# Patient Record
Sex: Male | Born: 1960 | Race: White | Hispanic: No | Marital: Married | Smoking: Former smoker
Health system: Southern US, Community
[De-identification: ages and names within clinical notes are randomized; demographics above are authoritative.]

## PROBLEM LIST (undated history)

## (undated) DIAGNOSIS — I10 Essential (primary) hypertension: Secondary | ICD-10-CM

## (undated) HISTORY — DX: Essential (primary) hypertension: I10

---

## 2000-10-03 ENCOUNTER — Emergency Department (HOSPITAL_COMMUNITY): Admission: EM | Admit: 2000-10-03 | Discharge: 2000-10-04 | Payer: Self-pay | Admitting: Emergency Medicine

## 2000-10-04 ENCOUNTER — Encounter: Payer: Self-pay | Admitting: Emergency Medicine

## 2000-12-31 ENCOUNTER — Ambulatory Visit (HOSPITAL_COMMUNITY): Admission: RE | Admit: 2000-12-31 | Discharge: 2000-12-31 | Payer: Self-pay | Admitting: Neurological Surgery

## 2000-12-31 ENCOUNTER — Encounter: Payer: Self-pay | Admitting: Neurological Surgery

## 2001-08-18 ENCOUNTER — Ambulatory Visit (HOSPITAL_COMMUNITY): Admission: RE | Admit: 2001-08-18 | Discharge: 2001-08-18 | Payer: Self-pay | Admitting: Neurological Surgery

## 2001-08-18 ENCOUNTER — Encounter: Payer: Self-pay | Admitting: Neurological Surgery

## 2001-08-26 ENCOUNTER — Observation Stay (HOSPITAL_COMMUNITY): Admission: RE | Admit: 2001-08-26 | Discharge: 2001-08-26 | Payer: Self-pay | Admitting: Neurological Surgery

## 2001-08-26 ENCOUNTER — Encounter: Payer: Self-pay | Admitting: Neurological Surgery

## 2001-09-12 ENCOUNTER — Encounter: Admission: RE | Admit: 2001-09-12 | Discharge: 2001-09-12 | Payer: Self-pay | Admitting: Neurological Surgery

## 2001-09-12 ENCOUNTER — Encounter: Payer: Self-pay | Admitting: Neurological Surgery

## 2020-07-23 ENCOUNTER — Emergency Department (HOSPITAL_COMMUNITY): Payer: Commercial Managed Care - PPO

## 2020-07-23 ENCOUNTER — Encounter (HOSPITAL_COMMUNITY): Payer: Self-pay

## 2020-07-23 ENCOUNTER — Inpatient Hospital Stay (HOSPITAL_COMMUNITY)
Admission: EM | Admit: 2020-07-23 | Discharge: 2020-07-29 | DRG: 177 | Disposition: A | Discharge status: Home / Self Care | Payer: Commercial Managed Care - PPO | Attending: Internal Medicine | Admitting: Internal Medicine

## 2020-07-23 ENCOUNTER — Other Ambulatory Visit: Payer: Self-pay

## 2020-07-23 DIAGNOSIS — I10 Essential (primary) hypertension: Secondary | ICD-10-CM | POA: Diagnosis present

## 2020-07-23 DIAGNOSIS — R0602 Shortness of breath: Secondary | ICD-10-CM | POA: Diagnosis present

## 2020-07-23 DIAGNOSIS — K649 Unspecified hemorrhoids: Secondary | ICD-10-CM | POA: Diagnosis present

## 2020-07-23 DIAGNOSIS — R739 Hyperglycemia, unspecified: Secondary | ICD-10-CM | POA: Diagnosis not present

## 2020-07-23 DIAGNOSIS — E119 Type 2 diabetes mellitus without complications: Secondary | ICD-10-CM

## 2020-07-23 DIAGNOSIS — R0902 Hypoxemia: Secondary | ICD-10-CM

## 2020-07-23 DIAGNOSIS — Z79899 Other long term (current) drug therapy: Secondary | ICD-10-CM | POA: Diagnosis not present

## 2020-07-23 DIAGNOSIS — Z794 Long term (current) use of insulin: Secondary | ICD-10-CM | POA: Diagnosis not present

## 2020-07-23 DIAGNOSIS — T380X5A Adverse effect of glucocorticoids and synthetic analogues, initial encounter: Secondary | ICD-10-CM | POA: Diagnosis not present

## 2020-07-23 DIAGNOSIS — J1282 Pneumonia due to coronavirus disease 2019: Secondary | ICD-10-CM | POA: Diagnosis present

## 2020-07-23 DIAGNOSIS — E876 Hypokalemia: Secondary | ICD-10-CM | POA: Diagnosis present

## 2020-07-23 DIAGNOSIS — E1165 Type 2 diabetes mellitus with hyperglycemia: Secondary | ICD-10-CM | POA: Diagnosis not present

## 2020-07-23 DIAGNOSIS — J9601 Acute respiratory failure with hypoxia: Secondary | ICD-10-CM | POA: Diagnosis present

## 2020-07-23 DIAGNOSIS — U071 COVID-19: Secondary | ICD-10-CM | POA: Diagnosis present

## 2020-07-23 DIAGNOSIS — E1169 Type 2 diabetes mellitus with other specified complication: Secondary | ICD-10-CM | POA: Diagnosis not present

## 2020-07-23 LAB — PROCALCITONIN: Procalcitonin: 0.28 ng/mL

## 2020-07-23 LAB — COMPREHENSIVE METABOLIC PANEL
ALT: 42 U/L (ref 0–44)
AST: 32 U/L (ref 15–41)
Albumin: 3.2 g/dL — ABNORMAL LOW (ref 3.5–5.0)
Alkaline Phosphatase: 63 U/L (ref 38–126)
Anion gap: 11 (ref 5–15)
BUN: 22 mg/dL — ABNORMAL HIGH (ref 6–20)
CO2: 25 mmol/L (ref 22–32)
Calcium: 8.4 mg/dL — ABNORMAL LOW (ref 8.9–10.3)
Chloride: 106 mmol/L (ref 98–111)
Creatinine, Ser: 1.02 mg/dL (ref 0.61–1.24)
GFR calc Af Amer: >60 mL/min (ref >60)
GFR calc non Af Amer: >60 mL/min (ref >60)
Glucose, Bld: 134 mg/dL — ABNORMAL HIGH (ref 70–99)
Potassium: 3.1 mmol/L — ABNORMAL LOW (ref 3.5–5.1)
Sodium: 142 mmol/L (ref 135–145)
Total Bilirubin: 0.5 mg/dL (ref 0.3–1.2)
Total Protein: 6.8 g/dL (ref 6.5–8.1)

## 2020-07-23 LAB — CBC WITH DIFFERENTIAL/PLATELET
Abs Immature Granulocytes: 0.06 10*3/uL (ref 0.00–0.07)
Basophils Absolute: 0 10*3/uL (ref 0.0–0.1)
Basophils Relative: 0 %
Eosinophils Absolute: 0 10*3/uL (ref 0.0–0.5)
Eosinophils Relative: 0 %
HCT: 45.7 % (ref 39.0–52.0)
Hemoglobin: 15.1 g/dL (ref 13.0–17.0)
Immature Granulocytes: 1 %
Lymphocytes Relative: 14 %
Lymphs Abs: 1.1 10*3/uL (ref 0.7–4.0)
MCH: 29.1 pg (ref 26.0–34.0)
MCHC: 33 g/dL (ref 30.0–36.0)
MCV: 88.1 fL (ref 80.0–100.0)
Monocytes Absolute: 0.7 10*3/uL (ref 0.1–1.0)
Monocytes Relative: 8 %
Neutro Abs: 6.2 10*3/uL (ref 1.7–7.7)
Neutrophils Relative %: 77 %
Platelets: 292 10*3/uL (ref 150–400)
RBC: 5.19 MIL/uL (ref 4.22–5.81)
RDW: 12.6 % (ref 11.5–15.5)
WBC: 8.1 10*3/uL (ref 4.0–10.5)
nRBC: 0 % (ref 0.0–0.2)

## 2020-07-23 LAB — CBC
HCT: 47.1 % (ref 39.0–52.0)
Hemoglobin: 15.8 g/dL (ref 13.0–17.0)
MCH: 29.7 pg (ref 26.0–34.0)
MCHC: 33.5 g/dL (ref 30.0–36.0)
MCV: 88.5 fL (ref 80.0–100.0)
Platelets: 303 10*3/uL (ref 150–400)
RBC: 5.32 MIL/uL (ref 4.22–5.81)
RDW: 12.7 % (ref 11.5–15.5)
WBC: 7.7 10*3/uL (ref 4.0–10.5)
nRBC: 0 % (ref 0.0–0.2)

## 2020-07-23 LAB — D-DIMER, QUANTITATIVE: D-Dimer, Quant: 0.74 ug/mL-FEU — ABNORMAL HIGH (ref 0.00–0.50)

## 2020-07-23 LAB — CREATININE, SERUM
Creatinine, Ser: 1.11 mg/dL (ref 0.61–1.24)
GFR calc Af Amer: >60 mL/min (ref >60)
GFR calc non Af Amer: >60 mL/min (ref >60)

## 2020-07-23 LAB — C-REACTIVE PROTEIN: CRP: 5.3 mg/dL — ABNORMAL HIGH (ref <1.0)

## 2020-07-23 LAB — FIBRINOGEN: Fibrinogen: 529 mg/dL — ABNORMAL HIGH (ref 210–475)

## 2020-07-23 LAB — LACTIC ACID, PLASMA: Lactic Acid, Venous: 1.4 mmol/L (ref 0.5–1.9)

## 2020-07-23 LAB — FERRITIN: Ferritin: 859 ng/mL — ABNORMAL HIGH (ref 24–336)

## 2020-07-23 LAB — SARS CORONAVIRUS 2 BY RT PCR (HOSPITAL ORDER, PERFORMED IN ~~LOC~~ HOSPITAL LAB): SARS Coronavirus 2: POSITIVE — AB

## 2020-07-23 LAB — TRIGLYCERIDES: Triglycerides: 285 mg/dL — ABNORMAL HIGH (ref <150)

## 2020-07-23 LAB — LACTATE DEHYDROGENASE: LDH: 241 U/L — ABNORMAL HIGH (ref 98–192)

## 2020-07-23 LAB — MAGNESIUM: Magnesium: 1.8 mg/dL (ref 1.7–2.4)

## 2020-07-23 MED ORDER — SODIUM CHLORIDE 0.9 % IV SOLN
100.0000 mg | Freq: Every day | INTRAVENOUS | Status: AC
  Administered 2020-07-24 – 2020-07-27 (×4): 100 mg via INTRAVENOUS
  Filled 2020-07-23 (×4): qty 20

## 2020-07-23 MED ORDER — SODIUM CHLORIDE 0.9 % IV SOLN
200.0000 mg | Freq: Once | INTRAVENOUS | Status: AC
  Administered 2020-07-23: 200 mg via INTRAVENOUS
  Filled 2020-07-23: qty 200

## 2020-07-23 MED ORDER — ZOLPIDEM TARTRATE 10 MG PO TABS
10.0000 mg | ORAL_TABLET | Freq: Once | ORAL | Status: AC
  Administered 2020-07-23: 10 mg via ORAL
  Filled 2020-07-23: qty 1

## 2020-07-23 MED ORDER — HYDROCOD POLST-CPM POLST ER 10-8 MG/5ML PO SUER
5.0000 mL | Freq: Two times a day (BID) | ORAL | Status: DC | PRN
  Administered 2020-07-23: 5 mL via ORAL
  Filled 2020-07-23: qty 5

## 2020-07-23 MED ORDER — METHYLPREDNISOLONE SODIUM SUCC 125 MG IJ SOLR
125.0000 mg | Freq: Once | INTRAMUSCULAR | Status: AC
  Administered 2020-07-23: 125 mg via INTRAVENOUS
  Filled 2020-07-23: qty 2

## 2020-07-23 MED ORDER — ZINC SULFATE 220 (50 ZN) MG PO CAPS
220.0000 mg | ORAL_CAPSULE | Freq: Every day | ORAL | Status: DC
  Administered 2020-07-23 – 2020-07-29 (×7): 220 mg via ORAL
  Filled 2020-07-23 (×7): qty 1

## 2020-07-23 MED ORDER — ENOXAPARIN SODIUM 40 MG/0.4ML ~~LOC~~ SOLN
40.0000 mg | SUBCUTANEOUS | Status: DC
  Administered 2020-07-23: 40 mg via SUBCUTANEOUS
  Filled 2020-07-23: qty 0.4

## 2020-07-23 MED ORDER — ASCORBIC ACID 500 MG PO TABS
500.0000 mg | ORAL_TABLET | Freq: Every day | ORAL | Status: DC
  Administered 2020-07-23 – 2020-07-29 (×7): 500 mg via ORAL
  Filled 2020-07-23 (×7): qty 1

## 2020-07-23 MED ORDER — POTASSIUM CHLORIDE CRYS ER 20 MEQ PO TBCR
40.0000 meq | EXTENDED_RELEASE_TABLET | Freq: Once | ORAL | Status: AC
  Administered 2020-07-23: 40 meq via ORAL
  Filled 2020-07-23: qty 2

## 2020-07-23 MED ORDER — IPRATROPIUM-ALBUTEROL 20-100 MCG/ACT IN AERS
1.0000 | INHALATION_SPRAY | Freq: Four times a day (QID) | RESPIRATORY_TRACT | Status: DC
  Administered 2020-07-23: 1 via RESPIRATORY_TRACT
  Filled 2020-07-23: qty 4

## 2020-07-23 MED ORDER — HYDROCORTISONE ACETATE 25 MG RE SUPP
25.0000 mg | Freq: Two times a day (BID) | RECTAL | Status: DC
  Administered 2020-07-28: 25 mg via RECTAL
  Filled 2020-07-23 (×12): qty 1

## 2020-07-23 MED ORDER — PREDNISONE 50 MG PO TABS: 50.0000 mg | ORAL_TABLET | Freq: Every day | ORAL | Status: DC

## 2020-07-23 MED ORDER — METHYLPREDNISOLONE SODIUM SUCC 125 MG IJ SOLR
0.5000 mg/kg | Freq: Two times a day (BID) | INTRAMUSCULAR | Status: DC
  Administered 2020-07-24: 56.875 mg via INTRAVENOUS
  Filled 2020-07-23: qty 2

## 2020-07-23 MED ORDER — GUAIFENESIN-DM 100-10 MG/5ML PO SYRP
10.0000 mL | ORAL_SOLUTION | ORAL | Status: DC | PRN
  Administered 2020-07-24: 10 mL via ORAL
  Filled 2020-07-23: qty 10

## 2020-07-23 MED ORDER — ALBUTEROL SULFATE HFA 108 (90 BASE) MCG/ACT IN AERS
6.0000 | INHALATION_SPRAY | Freq: Once | RESPIRATORY_TRACT | Status: AC
  Administered 2020-07-23: 6 via RESPIRATORY_TRACT
  Filled 2020-07-23: qty 6.7

## 2020-07-23 NOTE — ED Notes (Signed)
Initially pt unable to come of non-rebreather with stat of 85% on [place]. PT currently on 6 L [place] with an o2 stat of 90-93%Spoke to  respiratory and they advised to leave pt on  unless 02 stat decreased to below 86%. Then called respiratory.

## 2020-07-23 NOTE — H&P (Addendum)
History and Physical    Ricardo Davis WER:[id number] DOB: 04-06-1961 DOA: 07/23/2020  PCP: Barbie Banner, MD Patient coming from: Home  Chief Complaint: Cough and shortness of breath for the last 7 to 10 days.  HPI: Ricardo Davis is a 59 y.o. male with medical history significant of hypertension non-smoker truck driver Covid +5 days prior to this admission in [place] with increasing cough and shortness of breath with fever.  He complains of nausea no vomiting or diarrhea abdominal pain or chest pain.  He was treated with Zithromax and Decadron as an outpatient without any significant benefit.  he did not take covid vaccine He has complaints of hemorrhoids chronic   ED Course: Vital signs 1 3685 temperature 99.1 respiratory rate 2192% on 6 L Labs potassium 3.1 sodium 142 BUN 22 creatinine 1.02 AST ALT normal LDH 241 triglycerides 285 ferritin 859 CRP 5.3 lactic acid 1.4 procalcitonin 0.28 white count 8.1 hemoglobin 15.1. 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 No pertinent past medical history.  History reviewed. No pertinent surgical history.  Social History   Socioeconomic History  . Marital status: Married    Spouse name: Not on file  . Number of children: Not on file  . Years of education: Not on file  . Highest education level: Not on file  Occupational History  . Not on file  Tobacco Use  . Smoking status: Not on file  Substance and Sexual Activity  . Alcohol use: Not on file  . Drug use: Not on file  . Sexual activity: Not on file  Other Topics Concern  . Not on file  Social History Narrative  . Not on file   Social Determinants of Health   Financial Resource Strain:   . Difficulty of Paying Living Expenses: Not on file  Food Insecurity:   . Worried About Programme researcher, broadcasting/film/video in the Last Year:  Not on file  . Ran Out of Food in the Last Year: Not on file  Transportation Needs:   . Lack of Transportation (Medical): Not on file  . Lack of Transportation (Non-Medical): Not on file  Physical Activity:   . Days of Exercise per Week: Not on file  . Minutes of Exercise per Session: Not on file  Stress:   . Feeling of Stress : Not on file  Social Connections:   . Frequency of Communication with Friends and Family: Not on file  . Frequency of Social Gatherings with Friends and Family: Not on file  . Attends Religious Services: Not on file  . Active Member of Clubs or Organizations: Not on file  . Attends Banker Meetings: Not on file  . Marital Status: Not on file  Intimate Partner Violence:   . Fear of Current or Ex-Partner: Not on file  . Emotionally Abused: Not on file  . Physically Abused: Not on file  . Sexually Abused: Not on file    Not on File  No family history on file.    Prior to Admission medications   Medication Sig Start Date End Date Taking? Authorizing Provider  albuterol (VENTOLIN HFA) 108 (90 Base) MCG/ACT inhaler Inhale 2 puffs into the lungs every 6 (six) hours as needed for shortness of breath or wheezing. 07/18/20   [provider]  amLODipine (NORVASC) 5 MG tablet Take 5 mg  by mouth daily. 06/30/20   [provider]  azithromycin (ZITHROMAX) 250 MG tablet Take by mouth. 07/18/20   [provider]  predniSONE (DELTASONE) 20 MG tablet Take 40 mg by mouth daily. For 5 days 07/18/20   [provider]  zolpidem (AMBIEN) 10 MG tablet Take 10 mg by mouth at bedtime as needed for sleep. 06/30/20   [provider]    Physical Exam: Vitals:   07/23/20 1530 07/23/20 1630 07/23/20 1700 07/23/20 1719  BP: 122/72 119/71 136/85   Pulse: 88 93 98   Resp: (!) 28 (!) 25 (!) 21   Temp:    99.1 F (37.3 C)  SpO2: 99% 94% 92%   Weight:      Height:         . General:  Appears calm and comfortable . Eyes:  PERRL,  EOMI, normal lids, iris . ENT:  grossly normal hearing, lips & tongue, mmm . Neck:  no LAD, masses or thyromegaly . Cardiovascular:  RRR, no m/r/g. No LE edema.  Marland Kitchen Respiratory: coarse breath sounds  bilaterally, no w/r/r. Normal respiratory effort. . Abdomen: soft, ntnd, NABS . Skin: no rash or induration seen on limited exam . Musculoskeletal:  grossly normal tone BUE/BLE, good ROM, no bony abnormality . Psychiatric: grossly normal mood and affect, speech fluent and appropriate, AOx3 . Neurologic:  CN 2-12 grossly intact, moves all extremities in coordinated fashion, sensation intact  Labs on Admission: I have personally reviewed following labs and imaging studies  CBC: Recent Labs  Lab 07/23/20 1531  WBC 8.1  NEUTROABS 6.2  HGB 15.1  HCT 45.7  MCV 88.1  PLT 292   Basic Metabolic Panel: Recent Labs  Lab 07/23/20 1531  NA 142  K 3.1*  CL 106  CO2 25  GLUCOSE 134*  BUN 22*  CREATININE 1.02  CALCIUM 8.4*   GFR: Estimated Creatinine Clearance: 108.6 mL/min (by C-G formula based on SCr of 1.02 mg/dL). Liver Function Tests: Recent Labs  Lab 07/23/20 1531  AST 32  ALT 42  ALKPHOS 63  BILITOT 0.5  PROT 6.8  ALBUMIN 3.2*   No results for input(s): LIPASE, AMYLASE in the last 168 hours. No results for input(s): AMMONIA in the last 168 hours. Coagulation Profile: No results for input(s): INR, PROTIME in the last 168 hours. Cardiac Enzymes: No results for input(s): CKTOTAL, CKMB, CKMBINDEX, TROPONINI in the last 168 hours. BNP (last 3 results) No results for input(s): PROBNP in the last 8760 hours. HbA1C: No results for input(s): HGBA1C in the last 72 hours. CBG: No results for input(s): GLUCAP in the last 168 hours. Lipid Profile: Recent Labs    07/23/20 1531  TRIG 285*   Thyroid Function Tests: No results for input(s): TSH, T4TOTAL, FREET4, T3FREE, THYROIDAB in the last 72 hours. Anemia Panel: Recent Labs    07/23/20 1531  FERRITIN 859*   Urine  analysis: No results found for: COLORURINE, APPEARANCEUR, LABSPEC, PHURINE, GLUCOSEU, HGBUR, BILIRUBINUR, KETONESUR, PROTEINUR, UROBILINOGEN, NITRITE, LEUKOCYTESUR  Creatinine Clearance: Estimated Creatinine Clearance: 108.6 mL/min (by C-G formula based on SCr of 1.02 mg/dL).  Sepsis Labs: @LABRCNTIP (procalcitonin:4,lacticidven:4) )No results found for this or any previous visit (from the past 240 hour(s)).   Radiological Exams on Admission: DG Chest Port 1 View  Result Date: 07/23/2020 CLINICAL DATA:  Shortness of breath. COVID-19 positive. EXAM: PORTABLE CHEST 1 VIEW COMPARISON:  None. FINDINGS: Cardiomediastinal silhouette is normal. Mediastinal contours appear intact. Hazy airspace opacities in bilateral lungs with lower lobe predominance.  Osseous structures are without acute abnormality. Soft tissues are grossly normal. IMPRESSION: Hazy airspace opacities in bilateral lungs with lower lobe predominance, consistent with multifocal atypical pneumonia. Electronically Signed   By: Ted Mcalpine M.D.   On: 07/23/2020 16:05      Assessment/Plan Active Problems:   Hypoxia   #1 acute hypoxic respiratory failure secondary to Covid pneumonia.  Patient is not on oxygen at home.  He is admitted with severe shortness of breath and cough and fever with nausea.  He is found to have bilateral infiltrates by chest x-ray with hypoxia dependent on 6 L to keep his sats above 90%.  Covid markers as above.   Covid order set initiated. Encourage proning  #2 hypokalemia replete.check mag  #3 hemorrhoids Anusol HC ordered   #4 HTN on norvasc at home hold bp soft   Severity of Illness: The appropriate patient status for this patient is INPATIENT. Inpatient status is judged to be reasonable and necessary in order to provide the required intensity of service to ensure the patient's safety. The patient's presenting symptoms, physical exam findings, and initial radiographic and laboratory data in the  context of their chronic comorbidities is felt to place them at high risk for further clinical deterioration. Furthermore, it is not anticipated that the patient will be medically stable for discharge from the hospital within 2 midnights of admission. The following factors support the patient status of inpatient.   " The patient's presenting symptoms include .cough sob " The worrisome physical exam findings include wheezing hypoxia " The initial radiographic and laboratory data are worrisome because of b/l infiltrates hypokalemia " The chronic co-morbidities include htn   * I certify that at the point of admission it is my clinical judgment that the patient will require inpatient hospital care spanning beyond 2 midnights from the point of admission due to high intensity of service, high risk for further deterioration and high frequency of surveillance required.*   Estimated body mass index is 31.25 kg/m as calculated from the following:   Height as of this encounter: 6\' 3"  (1.905 m).   Weight as of this encounter: 113.4 kg.   DVT prophylaxis:lovenox Code Status:  full Family Communication: none at bedside Disposition Plan:tbd Consults called: none Admission status:  In patient   MD  07/23/2020, 5:28 PM

## 2020-07-23 NOTE — ED Triage Notes (Signed)
BIB EMS form home. Test postive for covid apx. 2 weeks ago was feeling better today increase SHOB and fatigue. PT RA O2 stat 85%. PT on Non-rebreather with EMS. PT was hypotensive with EMS at 95/60 500 bolus given now within normal limits. Pt has cough, shob,fever, and chills. Denies chest pain at this time.

## 2020-07-23 NOTE — ED Provider Notes (Signed)
[facility]-EMERGENCY DEPT Provider Note   CSN: [id number] Arrival date & time: 07/23/20  1503     History Chief Complaint  Patient presents with  . Shortness of Breath  . Covid Positive    Ricardo Davis is a 59 y.o. male.  Patient complains of shortness of breath.  Patient has been positive for Covid for over a week.  He was treated with Zithromax and steroids and now over the last 24 hours he becomes really short of breath  The history is provided by the patient. No language interpreter was used.  Shortness of Breath Severity:  Moderate Onset quality:  Sudden Timing:  Constant Progression:  Worsening Chronicity:  Recurrent Context: activity   Relieved by:  Nothing Worsened by:  Nothing Ineffective treatments:  None tried Associated symptoms: no abdominal pain, no chest pain, no cough, no headaches and no rash        History reviewed. No pertinent past medical history.  There are no problems to display for this patient.   History reviewed. No pertinent surgical history.     No family history on file.  Social History   Tobacco Use  . Smoking status: Not on file  Substance Use Topics  . Alcohol use: Not on file  . Drug use: Not on file    Home Medications Prior to Admission medications   Medication Sig Start Date End Date Taking? Authorizing Provider  albuterol (VENTOLIN HFA) 108 (90 Base) MCG/ACT inhaler Inhale 2 puffs into the lungs every 6 (six) hours as needed for shortness of breath or wheezing. 07/18/20   [provider]  amLODipine (NORVASC) 5 MG tablet Take 5 mg by mouth daily. 06/30/20   [provider]  azithromycin (ZITHROMAX) 250 MG tablet Take by mouth. 07/18/20   [provider]  predniSONE (DELTASONE) 20 MG tablet Take 40 mg by mouth daily. For 5 days 07/18/20   [provider]  zolpidem (AMBIEN) 10 MG tablet Take 10 mg by mouth at bedtime as needed for sleep. 06/30/20   [provider]    Allergies    Patient has no allergy information on record.  Review of Systems   Review of Systems  Constitutional: Negative for appetite change and fatigue.  HENT: Negative for congestion, ear discharge and sinus pressure.   Eyes: Negative for discharge.  Respiratory: Positive for shortness of breath. Negative for cough.   Cardiovascular: Negative for chest pain.  Gastrointestinal: Negative for abdominal pain and diarrhea.  Genitourinary: Negative for frequency and hematuria.  Musculoskeletal: Negative for back pain.  Skin: Negative for rash.  Neurological: Negative for seizures and headaches.  Psychiatric/Behavioral: Negative for hallucinations.    Physical Exam Updated Vital Signs BP 119/71   Pulse 93   Resp (!) 25   Ht 6\' 3"  (1.905 m)   Wt 113.4 kg   SpO2 94%   BMI 31.25 kg/m   Physical Exam Vitals and nursing note reviewed.  Constitutional:      Appearance: He is well-developed.  HENT:     Head: Normocephalic.     Mouth/Throat:     Mouth: Mucous membranes are moist.  Eyes:     General: No scleral icterus.    Conjunctiva/sclera: Conjunctivae normal.  Neck:     Thyroid: No thyromegaly.  Cardiovascular:     Rate and Rhythm: Normal rate and regular rhythm.     Heart sounds: No murmur heard.  No friction rub. No gallop.   Pulmonary:  Breath sounds: No stridor. No wheezing or rales.  Chest:     Chest wall: No tenderness.  Abdominal:     General: There is no distension.     Tenderness: There is no abdominal tenderness. There is no rebound.  Musculoskeletal:        General: Normal range of motion.     Cervical back: Neck supple.  Lymphadenopathy:     Cervical: No cervical adenopathy.  Skin:    Findings: No erythema or rash.  Neurological:     Mental Status: He is oriented to person, place, and time.     Motor: No abnormal muscle tone.     Coordination: Coordination normal.  Psychiatric:        Behavior: Behavior normal.     ED Results /  Procedures / Treatments   Labs (all labs ordered are listed, but only abnormal results are displayed) Labs Reviewed  COMPREHENSIVE METABOLIC PANEL - Abnormal; Notable for the following components:      Result Value   Potassium 3.1 (*)    Glucose, Bld 134 (*)    BUN 22 (*)    Calcium 8.4 (*)    Albumin 3.2 (*)    All other components within normal limits  D-DIMER, QUANTITATIVE (NOT AT [facility]) - Abnormal; Notable for the following components:   D-Dimer, Quant 0.74 (*)    All other components within normal limits  LACTATE DEHYDROGENASE - Abnormal; Notable for the following components:   LDH 241 (*)    All other components within normal limits  FERRITIN - Abnormal; Notable for the following components:   Ferritin 859 (*)    All other components within normal limits  TRIGLYCERIDES - Abnormal; Notable for the following components:   Triglycerides 285 (*)    All other components within normal limits  FIBRINOGEN - Abnormal; Notable for the following components:   Fibrinogen 529 (*)    All other components within normal limits  C-REACTIVE PROTEIN - Abnormal; Notable for the following components:   CRP 5.3 (*)    All other components within normal limits  SARS CORONAVIRUS 2 BY RT PCR (HOSPITAL ORDER, PERFORMED IN [facility])  CULTURE, BLOOD (ROUTINE X 2)  CULTURE, BLOOD (ROUTINE X 2)  CBC WITH DIFFERENTIAL/PLATELET  PROCALCITONIN  LACTIC ACID, PLASMA  LACTIC ACID, PLASMA    EKG None  Radiology DG Chest Port 1 View  Result Date: 07/23/2020 CLINICAL DATA:  Shortness of breath. COVID-19 positive. EXAM: PORTABLE CHEST 1 VIEW COMPARISON:  None. FINDINGS: Cardiomediastinal silhouette is normal. Mediastinal contours appear intact. Hazy airspace opacities in bilateral lungs with lower lobe predominance. Osseous structures are without acute abnormality. Soft tissues are grossly normal. IMPRESSION: Hazy airspace opacities in bilateral lungs with lower lobe predominance,  consistent with multifocal atypical pneumonia. Electronically Signed   By: Ted Mcalpine M.D.   On: 07/23/2020 16:05    Procedures Procedures (including critical care time)  Medications Ordered in ED Medications  methylPREDNISolone sodium succinate (SOLU-MEDROL) 125 mg/2 mL injection 125 mg (125 mg Intravenous Given 07/23/20 1558)  albuterol (VENTOLIN HFA) 108 (90 Base) MCG/ACT inhaler 6 puff (6 puffs Inhalation Given 07/23/20 1548)    ED Course  I have reviewed the triage vital signs and the nursing notes.  Pertinent labs & imaging results that were available during my care of the patient were reviewed by me and considered in my medical decision making (see chart for details). Ricardo Davis was evaluated in Emergency Department on 07/23/2020 for the  symptoms described in the history of present illness. He was evaluated in the context of the global COVID-19 pandemic, which necessitated consideration that the patient might be at risk for infection with the SARS-CoV-2 virus that causes COVID-19. Institutional protocols and algorithms that pertain to the evaluation of patients at risk for COVID-19 are in a state of rapid change based on information released by regulatory bodies including the CDC and federal and state organizations. These policies and algorithms were followed during the patient's care in the ED. CRITICAL CARE Performed by: Bethann Berkshire Total critical care time: 35 minutes Critical care time was exclusive of separately billable procedures and treating other patients. Critical care was necessary to treat or prevent imminent or life-threatening deterioration. Critical care was time spent personally by me on the following activities: development of treatment plan with patient and/or surrogate as well as nursing, discussions with consultants, evaluation of patient's response to treatment, examination of patient, obtaining history from patient or surrogate, ordering and performing  treatments and interventions, ordering and review of laboratory studies, ordering and review of radiographic studies, pulse oximetry and re-evaluation of patient's condition.    MDM Rules/Calculators/A&P                          Patient with pneumonia from Covid.  He has an oxygen requirement of about 6 L nasal.  He will be admitted to medicine        This patient presents to the ED for concern of sob, this involves an extensive number of treatment options, and is a complaint that carries with it a high risk of complications and morbidity.  The differential diagnosis includes covid.   Bacterial pneumonia   Lab Tests:   I Ordered, reviewed, and interpreted labs, which included CBC chemistries which showed mild hypokalemia  Medicines ordered:   I ordered medication albuterol and steroids for Covid  Imaging Studies ordered:   I ordered imaging studies which included chest x-ray  I independently visualized and interpreted imaging which showed viral pneumonia  Additional history obtained:   Additional history obtained from record and EMS  Previous records obtained and reviewed.  Consultations Obtained:   I consulted hospitalist and discussed lab and imaging findings  Reevaluation:  After the interventions stated above, I reevaluated the patient and found mild improvement  Critical Interventions:  .   Final Clinical Impression(s) / ED Diagnoses Final diagnoses:  None    Rx / DC Orders ED Discharge Orders    None       Bethann Berkshire, MD 07/23/20 1646

## 2020-07-23 NOTE — ED Notes (Signed)
Date and time results received: 07/23/20 5:45 PM  (use smartphrase ".now" to insert current time)  Test: Covid  Critical Value: positive  Name of Provider Notified: Sue Lush Primary RN   Orders Received? Or Actions Taken?:

## 2020-07-23 NOTE — Plan of Care (Signed)
Discussed with patient plan of care for the evening, pain management and sleep medication with some teach back displayed.  The patient updated his wife

## 2020-07-24 DIAGNOSIS — I10 Essential (primary) hypertension: Secondary | ICD-10-CM

## 2020-07-24 DIAGNOSIS — J9601 Acute respiratory failure with hypoxia: Secondary | ICD-10-CM | POA: Diagnosis present

## 2020-07-24 DIAGNOSIS — J1282 Pneumonia due to coronavirus disease 2019: Secondary | ICD-10-CM | POA: Diagnosis present

## 2020-07-24 LAB — CBC WITH DIFFERENTIAL/PLATELET
Abs Immature Granulocytes: 0.02 10*3/uL (ref 0.00–0.07)
Basophils Absolute: 0 10*3/uL (ref 0.0–0.1)
Basophils Relative: 0 %
Eosinophils Absolute: 0 10*3/uL (ref 0.0–0.5)
Eosinophils Relative: 0 %
HCT: 40.7 % (ref 39.0–52.0)
Hemoglobin: 13.5 g/dL (ref 13.0–17.0)
Immature Granulocytes: 1 %
Lymphocytes Relative: 12 %
Lymphs Abs: 0.5 10*3/uL — ABNORMAL LOW (ref 0.7–4.0)
MCH: 29.3 pg (ref 26.0–34.0)
MCHC: 33.2 g/dL (ref 30.0–36.0)
MCV: 88.3 fL (ref 80.0–100.0)
Monocytes Absolute: 0.3 10*3/uL (ref 0.1–1.0)
Monocytes Relative: 7 %
Neutro Abs: 3.5 10*3/uL (ref 1.7–7.7)
Neutrophils Relative %: 80 %
Platelets: 268 10*3/uL (ref 150–400)
RBC: 4.61 MIL/uL (ref 4.22–5.81)
RDW: 12.7 % (ref 11.5–15.5)
WBC: 4.3 10*3/uL (ref 4.0–10.5)
nRBC: 0 % (ref 0.0–0.2)

## 2020-07-24 LAB — COMPREHENSIVE METABOLIC PANEL
ALT: 38 U/L (ref 0–44)
AST: 27 U/L (ref 15–41)
Albumin: 2.8 g/dL — ABNORMAL LOW (ref 3.5–5.0)
Alkaline Phosphatase: 57 U/L (ref 38–126)
Anion gap: 12 (ref 5–15)
BUN: 26 mg/dL — ABNORMAL HIGH (ref 6–20)
CO2: 23 mmol/L (ref 22–32)
Calcium: 8.9 mg/dL (ref 8.9–10.3)
Chloride: 106 mmol/L (ref 98–111)
Creatinine, Ser: 1.03 mg/dL (ref 0.61–1.24)
GFR calc Af Amer: >60 mL/min (ref >60)
GFR calc non Af Amer: >60 mL/min (ref >60)
Glucose, Bld: 284 mg/dL — ABNORMAL HIGH (ref 70–99)
Potassium: 4.1 mmol/L (ref 3.5–5.1)
Sodium: 141 mmol/L (ref 135–145)
Total Bilirubin: 0.3 mg/dL (ref 0.3–1.2)
Total Protein: 6.3 g/dL — ABNORMAL LOW (ref 6.5–8.1)

## 2020-07-24 LAB — PHOSPHORUS: Phosphorus: 3.3 mg/dL (ref 2.5–4.6)

## 2020-07-24 LAB — D-DIMER, QUANTITATIVE: D-Dimer, Quant: 0.6 ug/mL-FEU — ABNORMAL HIGH (ref 0.00–0.50)

## 2020-07-24 LAB — LIPID PANEL
Cholesterol: 160 mg/dL (ref 0–200)
HDL: 29 mg/dL — ABNORMAL LOW (ref >40)
LDL Cholesterol: 94 mg/dL (ref 0–99)
Total CHOL/HDL Ratio: 5.5 RATIO
Triglycerides: 187 mg/dL — ABNORMAL HIGH (ref <150)
VLDL: 37 mg/dL (ref 0–40)

## 2020-07-24 LAB — HEMOGLOBIN A1C
Hgb A1c MFr Bld: 6.5 % — ABNORMAL HIGH (ref 4.8–5.6)
Mean Plasma Glucose: 139.85 mg/dL

## 2020-07-24 LAB — MAGNESIUM: Magnesium: 2 mg/dL (ref 1.7–2.4)

## 2020-07-24 LAB — HIV ANTIBODY (ROUTINE TESTING W REFLEX): HIV Screen 4th Generation wRfx: NONREACTIVE

## 2020-07-24 LAB — C-REACTIVE PROTEIN: CRP: 12.4 mg/dL — ABNORMAL HIGH (ref <1.0)

## 2020-07-24 LAB — FERRITIN: Ferritin: 741 ng/mL — ABNORMAL HIGH (ref 24–336)

## 2020-07-24 MED ORDER — BARICITINIB 2 MG PO TABS
4.0000 mg | ORAL_TABLET | Freq: Every day | ORAL | Status: DC
  Administered 2020-07-24 – 2020-07-29 (×6): 4 mg via ORAL
  Filled 2020-07-24 (×6): qty 2

## 2020-07-24 MED ORDER — METHYLPREDNISOLONE SODIUM SUCC 125 MG IJ SOLR
50.0000 mg | Freq: Two times a day (BID) | INTRAMUSCULAR | Status: AC
  Administered 2020-07-24 – 2020-07-26 (×5): 50 mg via INTRAVENOUS
  Filled 2020-07-24 (×5): qty 2

## 2020-07-24 MED ORDER — ALBUTEROL SULFATE HFA 108 (90 BASE) MCG/ACT IN AERS: 1.0000 | INHALATION_SPRAY | RESPIRATORY_TRACT | Status: DC | PRN

## 2020-07-24 MED ORDER — IPRATROPIUM-ALBUTEROL 20-100 MCG/ACT IN AERS
1.0000 | INHALATION_SPRAY | Freq: Four times a day (QID) | RESPIRATORY_TRACT | Status: DC
  Administered 2020-07-24 – 2020-07-26 (×10): 1 via RESPIRATORY_TRACT
  Filled 2020-07-24: qty 4

## 2020-07-24 MED ORDER — HYDROCOD POLST-CPM POLST ER 10-8 MG/5ML PO SUER
5.0000 mL | Freq: Two times a day (BID) | ORAL | Status: DC
  Administered 2020-07-24 – 2020-07-29 (×10): 5 mL via ORAL
  Filled 2020-07-24 (×10): qty 5

## 2020-07-24 MED ORDER — ZOLPIDEM TARTRATE 10 MG PO TABS
10.0000 mg | ORAL_TABLET | Freq: Once | ORAL | Status: AC
  Administered 2020-07-24: 10 mg via ORAL
  Filled 2020-07-24: qty 1

## 2020-07-24 MED ORDER — IPRATROPIUM-ALBUTEROL 20-100 MCG/ACT IN AERS
1.0000 | INHALATION_SPRAY | Freq: Three times a day (TID) | RESPIRATORY_TRACT | Status: DC
  Administered 2020-07-24: 1 via RESPIRATORY_TRACT
  Filled 2020-07-24: qty 4

## 2020-07-24 NOTE — Progress Notes (Signed)
PROGRESS NOTE    Ricardo Davis  WCH:[id number] DOB: Jul 27, 1961 DOA: 07/23/2020 PCP: Barbie Banner, MD    Brief Narrative:  Patient admitted to the hospital with a working diagnosis of acute hypoxic respiratory failure due to SARS COVID-19 viral pneumonia.  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  His symptoms were consistent with cough, fever, nausea and progressive dyspnea.  His symptoms were persistent despite outpatient therapy with azithromycin and dexamethasone.  He is not vaccinated for COVID-19.  On his initial physical examination blood pressure 122/72, pulse rate 88, respiratory 28, oxygen saturation 92% on supplemental oxygen, temperature 99.1, his lungs had coarse breath sounds bilaterally, heart S1-S2, present rhythmic, soft abdomen, no lower extremity edema. Sodium 142, potassium 3.1, chloride 106, bicarb 25, glucose 134, BUN 22, creatinine 1.0, white count 8.1, hemoglobin 15.1, hematocrit 45.7, platelets 292.  SARS COVID-19 positive.  Chest radiograph with bilateral interstitial infiltrates, left lower lobe, left upper lobe and right upper lobe. EKG 83 bpm, left axis deviation, normal intervals, sinus rhythm, no ST segment or T wave changes, low voltage.   Assessment & Plan:   Principal Problem:   Pneumonia due to COVID-19 virus Active Problems:   Hypokalemia   Essential hypertension   Acute respiratory failure with hypoxia (HCC)   1. Acute hypoxic respiratory failure due to SARS COVID 19 viral pneumonia.   RR: 20  Pulse oxymetry: 92%  Fi02: 6 L/min per [place]    COVID-19 Labs  Recent Labs    07/23/20 1531 07/24/20 0452  DDIMER 0.74* 0.60*  FERRITIN 859* 741*  LDH 241*  --   CRP 5.3* 12.4*    Lab Results  Component Value Date   SARSCOV2NAA POSITIVE (A) 07/23/2020     Patient with high inflammatory markers, high oxygen requirements and bilateral  infiltrates.   In the setting of high risk of worsening hypoxic respiratory failure, I explained the patient in detail, the use of advanced therapy: Baricitinib.  I informed him the risks of this interventions including immunosuppression, opportunistic infections and others.  I explained him the experimental nature of this interventions, in the face of current COVID 19 pandemic, along with the potential benefits of improvement of hypoxic respiratory failure and systemic inflammation.  He has agreed to proceed and consent was signed.  Start patient on Baricitinib.   Continue aggressive medical therapy with high dose systemic steroids, Remdesivir #2/5, bronchodilators, antitussive agents and airway clearing techniques. Encourage out of bed and prone position as tolerated.   Continue to follow inflammatory markers, keep oxygen saturation more than 88%.    Patient continue to be at high risk for worsening hypoxemic respiratory failure.   Status is: Inpatient  Remains inpatient appropriate because:IV treatments appropriate due to intensity of illness or inability to take PO   Dispo: The patient is from: Home              Anticipated d/c is to: Home              Anticipated d/c date is: > 3 days              Patient currently is not medically stable to d/c.    DVT prophylaxis: Enoxaparin   Code Status:   full  Family Communication:  I spoke over the phone with the patient's wife about patient's  condition, plan of care, prognosis and all questions were addressed.  Subjective: Patient is feeling better, but not yet back to baseline, continue to have dyspnea, and worse with exertion, less nasal congestion.   Objective: Vitals:   07/24/20 0350 07/24/20 0400 07/24/20 0500 07/24/20 0900  BP:    110/63  Pulse:    67  Resp: 20 20 19 20   Temp:    97.8 F (36.6 C)  TempSrc:    Oral  SpO2:    92%  Weight:      Height:        Intake/Output Summary (Last 24 hours) at 07/24/2020  0933 Last data filed at 07/24/2020 0400 Gross per 24 hour  Intake 730 ml  Output 0 ml  Net 730 ml   Filed Weights   07/23/20 1511 07/24/20 0112  Weight: 113.4 kg 109.1 kg    Examination:   General: Not in pain or dyspnea, deconditioned  Neurology: Awake and alert, non focal  E ENT: no pallor, no icterus, oral mucosa moist Cardiovascular: No JVD. S1-S2 present, rhythmic, no gallops, rubs, or murmurs. No lower extremity edema. Pulmonary: positive breath sounds bilaterally Gastrointestinal. Abdomen soft and non tender Skin. No rashes Musculoskeletal: no joint deformities     Data Reviewed: I have personally reviewed following labs and imaging studies  CBC: Recent Labs  Lab 07/23/20 1531 07/23/20 1721 07/24/20 0452  WBC 8.1 7.7 4.3  NEUTROABS 6.2  --  3.5  HGB 15.1 15.8 13.5  HCT 45.7 47.1 40.7  MCV 88.1 88.5 88.3  PLT 292 303 268   Basic Metabolic Panel: Recent Labs  Lab 07/23/20 1531 07/23/20 1721 07/24/20 0452  NA 142  --  141  K 3.1*  --  4.1  CL 106  --  106  CO2 25  --  23  GLUCOSE 134*  --  284*  BUN 22*  --  26*  CREATININE 1.02 1.11 1.03  CALCIUM 8.4*  --  8.9  MG  --  1.8 2.0  PHOS  --   --  3.3   GFR: Estimated Creatinine Clearance: 104.1 mL/min (by C-G formula based on SCr of 1.03 mg/dL). Liver Function Tests: Recent Labs  Lab 07/23/20 1531 07/24/20 0452  AST 32 27  ALT 42 38  ALKPHOS 63 57  BILITOT 0.5 0.3  PROT 6.8 6.3*  ALBUMIN 3.2* 2.8*   No results for input(s): LIPASE, AMYLASE in the last 168 hours. No results for input(s): AMMONIA in the last 168 hours. Coagulation Profile: No results for input(s): INR, PROTIME in the last 168 hours. Cardiac Enzymes: No results for input(s): CKTOTAL, CKMB, CKMBINDEX, TROPONINI in the last 168 hours. BNP (last 3 results) No results for input(s): PROBNP in the last 8760 hours. HbA1C: Recent Labs    07/24/20 0452  HGBA1C 6.5*   CBG: No results for input(s): GLUCAP in the last 168  hours. Lipid Profile: Recent Labs    07/23/20 1531 07/24/20 0452  CHOL  --  160  HDL  --  29*  LDLCALC  --  94  TRIG 285* 187*  CHOLHDL  --  5.5   Thyroid Function Tests: No results for input(s): TSH, T4TOTAL, FREET4, T3FREE, THYROIDAB in the last 72 hours. Anemia Panel: Recent Labs    07/23/20 1531 07/24/20 0452  FERRITIN 859* 741*      Radiology Studies: I have reviewed all of the imaging during this hospital visit personally     Scheduled Meds: . vitamin C  500 mg Oral Daily  . enoxaparin (LOVENOX) injection  40 mg  Subcutaneous Q24H  . hydrocortisone  25 mg Rectal BID  . Ipratropium-Albuterol  1 puff Inhalation TID  . methylPREDNISolone (SOLU-MEDROL) injection  0.5 mg/kg Intravenous Q12H   Followed by  . [START ON 07/27/2020] predniSONE  50 mg Oral Daily  . zinc sulfate  220 mg Oral Daily   Continuous Infusions: . remdesivir 100 mg in NS 100 mL 100 mg (07/24/20 0914)     LOS: 1 day        Donjuan Robison Annett Gula, MD

## 2020-07-25 LAB — COMPREHENSIVE METABOLIC PANEL
ALT: 34 U/L (ref 0–44)
AST: 23 U/L (ref 15–41)
Albumin: 2.9 g/dL — ABNORMAL LOW (ref 3.5–5.0)
Alkaline Phosphatase: 58 U/L (ref 38–126)
Anion gap: 10 (ref 5–15)
BUN: 32 mg/dL — ABNORMAL HIGH (ref 6–20)
CO2: 23 mmol/L (ref 22–32)
Calcium: 9 mg/dL (ref 8.9–10.3)
Chloride: 107 mmol/L (ref 98–111)
Creatinine, Ser: 0.93 mg/dL (ref 0.61–1.24)
GFR calc Af Amer: >60 mL/min (ref >60)
GFR calc non Af Amer: >60 mL/min (ref >60)
Glucose, Bld: 271 mg/dL — ABNORMAL HIGH (ref 70–99)
Potassium: 3.7 mmol/L (ref 3.5–5.1)
Sodium: 140 mmol/L (ref 135–145)
Total Bilirubin: 0.4 mg/dL (ref 0.3–1.2)
Total Protein: 6.5 g/dL (ref 6.5–8.1)

## 2020-07-25 LAB — FERRITIN: Ferritin: 755 ng/mL — ABNORMAL HIGH (ref 24–336)

## 2020-07-25 LAB — GLUCOSE, CAPILLARY
Glucose-Capillary: 352 mg/dL — ABNORMAL HIGH (ref 70–99)
Glucose-Capillary: 322 mg/dL — ABNORMAL HIGH (ref 70–99)

## 2020-07-25 LAB — D-DIMER, QUANTITATIVE: D-Dimer, Quant: 0.74 ug/mL-FEU — ABNORMAL HIGH (ref 0.00–0.50)

## 2020-07-25 LAB — C-REACTIVE PROTEIN: CRP: 7.3 mg/dL — ABNORMAL HIGH (ref <1.0)

## 2020-07-25 MED ORDER — INSULIN ASPART 100 UNIT/ML ~~LOC~~ SOLN
0.0000 [IU] | Freq: Three times a day (TID) | SUBCUTANEOUS | Status: DC
  Administered 2020-07-25: 15 [IU] via SUBCUTANEOUS
  Administered 2020-07-26 (×3): 8 [IU] via SUBCUTANEOUS
  Administered 2020-07-27: 11 [IU] via SUBCUTANEOUS
  Administered 2020-07-27 (×2): 5 [IU] via SUBCUTANEOUS
  Administered 2020-07-28: 11 [IU] via SUBCUTANEOUS
  Administered 2020-07-28: 3 [IU] via SUBCUTANEOUS
  Administered 2020-07-28: 8 [IU] via SUBCUTANEOUS

## 2020-07-25 MED ORDER — INSULIN ASPART 100 UNIT/ML ~~LOC~~ SOLN
0.0000 [IU] | Freq: Every day | SUBCUTANEOUS | Status: DC
  Administered 2020-07-25: 4 [IU] via SUBCUTANEOUS
  Administered 2020-07-26: 3 [IU] via SUBCUTANEOUS
  Administered 2020-07-27: 4 [IU] via SUBCUTANEOUS

## 2020-07-25 MED ORDER — ZOLPIDEM TARTRATE 5 MG PO TABS
5.0000 mg | ORAL_TABLET | Freq: Every day | ORAL | Status: DC
  Administered 2020-07-25 – 2020-07-26 (×2): 5 mg via ORAL
  Filled 2020-07-25 (×3): qty 1

## 2020-07-25 MED ORDER — ENSURE ENLIVE PO LIQD
237.0000 mL | Freq: Two times a day (BID) | ORAL | Status: DC
  Administered 2020-07-25 – 2020-07-29 (×4): 237 mL via ORAL

## 2020-07-25 NOTE — Progress Notes (Signed)
Initial Nutrition Assessment  INTERVENTION:   -Ensure Enlive po BID, each supplement provides 350 kcal and 20 grams of protein  NUTRITION DIAGNOSIS:   Increased nutrient needs related to acute illness (COVID-19 infection) as evidenced by estimated needs.  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  His symptoms were consistent with cough, fever, nausea and progressive dyspnea.  His symptoms were persistent despite outpatient therapy with azithromycin and dexamethasone.  He is not vaccinated for COVID-19.  Patient has been positive for COVID-19 since 9/20.  Pt consumed 100% of breakfast this morning. The only nutrition related symptom pt has reported has been nausea PTA. Pt denies vomiting or diarrhea. Given increased needs from COVID-19 infection, will order Ensure supplements.  Per weight records in care everywhere, pt's weight has fluctuated between 255-272 lbs PTA. Now pt weighs 240 lbs as of 9/26. Will continue to monitor weight trends.   Labs reviewed. Medications: Vitamin C, Zinc sulfate  NUTRITION - FOCUSED PHYSICAL EXAM:  Unable to complete  Diet Order:   Diet Order            Diet regular Room service appropriate? Yes; Fluid consistency: Thin  Diet effective now                 EDUCATION NEEDS:      Skin:  Skin Assessment: Reviewed RN Assessment  Last BM:  9/26 -type 5  Height:   Ht Readings from Last 1 Encounters:  07/24/20 6\' 2"  (1.88 m)    Weight:   Wt Readings from Last 1 Encounters:  07/24/20 109.1 kg    BMI:  Body mass index is 30.88 kg/m.  Estimated Nutritional Needs:   Kcal:   07/26/20  Protein:  125-140g  Fluid:  2L/day  [id number], MS, RD, LDN Inpatient Clinical Dietitian Contact information available via Amion

## 2020-07-25 NOTE — Progress Notes (Addendum)
PROGRESS NOTE    Ricardo Davis  YQM:[id number] DOB: 25-Dec-1960 DOA: 07/23/2020 PCP: Barbie Banner, MD    Brief Narrative:  Patient admitted to the hospital with a working diagnosis of acute hypoxic respiratory failure due to SARS COVID-19 viral pneumonia.  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  His symptoms were consistent with cough, fever, nausea and progressive dyspnea.  His symptoms were persistent despite outpatient therapy with azithromycin and dexamethasone.  He is not vaccinated for COVID-19.  On his initial physical examination blood pressure 122/72, pulse rate 88, respiratory 28, oxygen saturation 92% on supplemental oxygen, temperature 99.1, his lungs had coarse breath sounds bilaterally, heart S1-S2, present rhythmic, soft abdomen, no lower extremity edema. Sodium 142, potassium 3.1, chloride 106, bicarb 25, glucose 134, BUN 22, creatinine 1.0, white count 8.1, hemoglobin 15.1, hematocrit 45.7, platelets 292.  SARS COVID-19 positive.  Chest radiograph with bilateral interstitial infiltrates, left lower lobe, left upper lobe and right upper lobe. EKG 83 bpm, left axis deviation, normal intervals, sinus rhythm, no ST segment or T wave changes, low voltage   Assessment & Plan:   Principal Problem:   Pneumonia due to COVID-19 virus Active Problems:   Hypokalemia   Essential hypertension   Acute respiratory failure with hypoxia (HCC)   1. Acute hypoxic respiratory failure due to SARS COVID 19 viral pneumonia.   RR: 22  Pulse oxymetry: 88%  Fi02: 9 to 10 L/min per [facility]   COVID-19 Labs  Recent Labs    07/23/20 1531 07/24/20 0452 07/25/20 0546  DDIMER 0.74* 0.60* 0.74*  FERRITIN 859* 741* 755*  LDH 241*  --   --   CRP 5.3* 12.4* 7.3*    Lab Results  Component Value Date   SARSCOV2NAA POSITIVE (A) 07/23/2020   Inflammatory markers and symptoms improving,  but not yet back to baseline.   Decrease supplemental 02 to 8 L/min to keep oxygen saturation more than 82%. Medical therapy with Remdesivir#3/5, Methylprednisolone and baricitinib. Bronchodilators, antitussive agents and airway clearing techniques.   Follow inflammatory markers, out of bed to cheir with meals, and PT/OT evaluation. Continue to encourage prone position 4 H at the time for a target of 16 H per day.   2. Insomnia. Patient tolerated well zolpidem last night, will resume at night.   3. Hypokalemia. Renal function stable, K up to 3,7 with bicarbonate at 23. Follow renal function in am. Hold on IV fluids for now.   4. HTN. Blood pressure controlled 122/72 mmHg. Off antihypertensive medications.  Discontinue telemetry.  Patient continue to be at high risk for worsening respiratory failure.   Status is: Inpatient  Remains inpatient appropriate because:IV treatments appropriate due to intensity of illness or inability to take PO   Dispo: The patient is from: Home              Anticipated d/c is to: Home              Anticipated d/c date is: 3 days              Patient currently is not medically stable to d/c.   DVT prophylaxis: Enoxaparin   Code Status:   full  Family Communication:      Subjective: Patient is feeling better, no nausea or vomiting, no chest pain, continue to have dyspnea on exertion and cough.   Objective: Vitals:   07/24/20 0900  07/24/20 1257 07/24/20 2210 07/25/20 0618  BP: 110/63 115/64 112/73 110/87  Pulse: 67 74 81 76  Resp: 20 20 20  (!) 22  Temp: 97.8 F (36.6 C) 97.6 F (36.4 C) 97.7 F (36.5 C) 97.6 F (36.4 C)  TempSrc: Oral Oral Oral Oral  SpO2: 92%  (!) 89% (!) 88%  Weight:      Height:        Intake/Output Summary (Last 24 hours) at 07/25/2020 1004 Last data filed at 07/25/2020 0900 Gross per 24 hour  Intake 540 ml  Output 625 ml  Net -85 ml   Filed Weights   07/23/20 1511 07/24/20 0112  Weight: 113.4 kg 109.1 kg     Examination:   General: Not in pain, no resting dyspnea, deconditioned.  Neurology: Awake and alert, non focal  E ENT: mild pallor, no icterus, oral mucosa moist Cardiovascular: No JVD. S1-S2 present, rhythmic, no gallops, rubs, or murmurs. No lower extremity edema. Pulmonary: positive breath sounds bilaterally,no wheezing, rhonchi or rales. Gastrointestinal. Abdomen soft and non tender Musculoskeletal: no joint deformities     Data Reviewed: I have personally reviewed following labs and imaging studies  CBC: Recent Labs  Lab 07/23/20 1531 07/23/20 1721 07/24/20 0452  WBC 8.1 7.7 4.3  NEUTROABS 6.2  --  3.5  HGB 15.1 15.8 13.5  HCT 45.7 47.1 40.7  MCV 88.1 88.5 88.3  PLT 292 303 268   Basic Metabolic Panel: Recent Labs  Lab 07/23/20 1531 07/23/20 1721 07/24/20 0452 07/25/20 0546  NA 142  --  141 140  K 3.1*  --  4.1 3.7  CL 106  --  106 107  CO2 25  --  23 23  GLUCOSE 134*  --  284* 271*  BUN 22*  --  26* 32*  CREATININE 1.02 1.11 1.03 0.93  CALCIUM 8.4*  --  8.9 9.0  MG  --  1.8 2.0  --   PHOS  --   --  3.3  --    GFR: Estimated Creatinine Clearance: 115.3 mL/min (by C-G formula based on SCr of 0.93 mg/dL). Liver Function Tests: Recent Labs  Lab 07/23/20 1531 07/24/20 0452 07/25/20 0546  AST 32 27 23  ALT 42 38 34  ALKPHOS 63 57 58  BILITOT 0.5 0.3 0.4  PROT 6.8 6.3* 6.5  ALBUMIN 3.2* 2.8* 2.9*   No results for input(s): LIPASE, AMYLASE in the last 168 hours. No results for input(s): AMMONIA in the last 168 hours. Coagulation Profile: No results for input(s): INR, PROTIME in the last 168 hours. Cardiac Enzymes: No results for input(s): CKTOTAL, CKMB, CKMBINDEX, TROPONINI in the last 168 hours. BNP (last 3 results) No results for input(s): PROBNP in the last 8760 hours. HbA1C: Recent Labs    07/24/20 0452  HGBA1C 6.5*   CBG: No results for input(s): GLUCAP in the last 168 hours. Lipid Profile: Recent Labs    07/23/20 1531  07/24/20 0452  CHOL  --  160  HDL  --  29*  LDLCALC  --  94  TRIG 285* 187*  CHOLHDL  --  5.5   Thyroid Function Tests: No results for input(s): TSH, T4TOTAL, FREET4, T3FREE, THYROIDAB in the last 72 hours. Anemia Panel: Recent Labs    07/24/20 0452 07/25/20 0546  FERRITIN 741* 755*      Radiology Studies: I have reviewed all of the imaging during this hospital visit personally     Scheduled Meds: . vitamin C  500 mg Oral Daily  .  baricitinib  4 mg Oral Daily  . chlorpheniramine-HYDROcodone  5 mL Oral Q12H  . hydrocortisone  25 mg Rectal BID  . Ipratropium-Albuterol  1 puff Inhalation Q6H  . methylPREDNISolone (SOLU-MEDROL) injection  50 mg Intravenous Q12H  . zinc sulfate  220 mg Oral Daily   Continuous Infusions: . remdesivir 100 mg in NS 100 mL 100 mg (07/25/20 0855)     LOS: 2 days        Semaje Kinker Annett Gula, MD

## 2020-07-26 DIAGNOSIS — R739 Hyperglycemia, unspecified: Secondary | ICD-10-CM

## 2020-07-26 DIAGNOSIS — E119 Type 2 diabetes mellitus without complications: Secondary | ICD-10-CM

## 2020-07-26 DIAGNOSIS — E1169 Type 2 diabetes mellitus with other specified complication: Secondary | ICD-10-CM

## 2020-07-26 DIAGNOSIS — T380X5A Adverse effect of glucocorticoids and synthetic analogues, initial encounter: Secondary | ICD-10-CM | POA: Diagnosis present

## 2020-07-26 LAB — C-REACTIVE PROTEIN: CRP: 2.2 mg/dL — ABNORMAL HIGH (ref <1.0)

## 2020-07-26 LAB — COMPREHENSIVE METABOLIC PANEL
ALT: 37 U/L (ref 0–44)
AST: 22 U/L (ref 15–41)
Albumin: 2.7 g/dL — ABNORMAL LOW (ref 3.5–5.0)
Alkaline Phosphatase: 54 U/L (ref 38–126)
Anion gap: 12 (ref 5–15)
BUN: 35 mg/dL — ABNORMAL HIGH (ref 6–20)
CO2: 22 mmol/L (ref 22–32)
Calcium: 8.9 mg/dL (ref 8.9–10.3)
Chloride: 103 mmol/L (ref 98–111)
Creatinine, Ser: 0.93 mg/dL (ref 0.61–1.24)
GFR calc Af Amer: >60 mL/min (ref >60)
GFR calc non Af Amer: >60 mL/min (ref >60)
Glucose, Bld: 275 mg/dL — ABNORMAL HIGH (ref 70–99)
Potassium: 3.8 mmol/L (ref 3.5–5.1)
Sodium: 137 mmol/L (ref 135–145)
Total Bilirubin: 0.3 mg/dL (ref 0.3–1.2)
Total Protein: 6.1 g/dL — ABNORMAL LOW (ref 6.5–8.1)

## 2020-07-26 LAB — GLUCOSE, CAPILLARY
Glucose-Capillary: 272 mg/dL — ABNORMAL HIGH (ref 70–99)
Glucose-Capillary: 284 mg/dL — ABNORMAL HIGH (ref 70–99)
Glucose-Capillary: 275 mg/dL — ABNORMAL HIGH (ref 70–99)
Glucose-Capillary: 255 mg/dL — ABNORMAL HIGH (ref 70–99)

## 2020-07-26 LAB — D-DIMER, QUANTITATIVE: D-Dimer, Quant: 0.66 ug/mL-FEU — ABNORMAL HIGH (ref 0.00–0.50)

## 2020-07-26 LAB — FERRITIN: Ferritin: 727 ng/mL — ABNORMAL HIGH (ref 24–336)

## 2020-07-26 MED ORDER — METHYLPREDNISOLONE SODIUM SUCC 125 MG IJ SOLR
40.0000 mg | Freq: Every day | INTRAMUSCULAR | Status: DC
  Administered 2020-07-27 – 2020-07-29 (×3): 40 mg via INTRAVENOUS
  Filled 2020-07-26 (×3): qty 2

## 2020-07-26 NOTE — Plan of Care (Signed)
Patient up in chair all day, utilizing incentive spirometer and flutter valve hourly.  Able to wean patients oxygen down to 4 liters by end of shift.

## 2020-07-26 NOTE — Progress Notes (Signed)
PT Cancellation Note  Patient Details Name: KIMONI PAGLIARULO MRN: [id number] DOB: 1961/04/13   Cancelled Treatment:    Reason Eval/Treat Not Completed: PT screened, no needs identified, will sign off Spoke with RN who reports pt is independently mobilizing-no PT needs. Will sign off.    Faye Ramsay, PT Acute Rehabilitation  Office: [phone] Pager: [phone]

## 2020-07-26 NOTE — Progress Notes (Signed)
Occupational Therapy Evaluation  Patient reports independent with ambulation to/from bathroom, up to chair and using IS "I want to get out of here, I'm not a hospital person." Instruct patient in HEP with green theraband, patient peform x15 reps with good understanding. Also educate patient in use of flutter valve to use in conjuncture with IS. Patient is highly motivated "I'll do whatever I need to so I can get out of here." Patient does desaturate to mid 80s with activity and recovers quickly to 90% with seated rest. Instruct patient in use of pulse oximeter at home to self monitor with activity, patient is familiar with devices as his spouse has one. All education completed at this time with patient verbalizing understanding, no further acute OT needs. Please re-consult if new needs arise.     07/26/20 1517  OT Visit Information  Last OT Received On 07/26/20  Assistance Needed +1  History of Present Illness Patient is a 59 year old male past medical history for hypertension, back surgery, who was diagnosed with COVID-19 September 20. Patient symptoms were persistent despite outpatient therapy with azithromycin and dexamethasone  Precautions  Precautions None  Precaution Comments monitor sats  Restrictions  Weight Bearing Restrictions No  Home Living  Family/patient expects to be discharged to: Private residence  Living Arrangements Spouse/significant other  Available Help at Discharge Family  Type of Home Mobile home  Home Access Ramped entrance (has rails for ramp)  Home Layout One level  Education officer, museum seat  Prior Function  Level of Independence Independent  Comments Truck driver  Communication  Communication No difficulties  Pain Assessment  Pain Assessment No/denies pain  Cognition  Arousal/Alertness Awake/alert  Behavior During Therapy WFL for tasks assessed/performed  Overall Cognitive Status Within  Functional Limits for tasks assessed  Upper Extremity Assessment  Upper Extremity Assessment Overall WFL for tasks assessed  Lower Extremity Assessment  Lower Extremity Assessment Overall WFL for tasks assessed  Cervical / Trunk Assessment  Cervical / Trunk Assessment Normal  ADL  Overall ADL's  Modified independent  General ADL Comments patient ambulating to bathroom without assistance  Bed Mobility  General bed mobility comments in chair  Transfers  Overall transfer level Modified independent  Balance  Overall balance assessment No apparent balance deficits (not formally assessed)  General Comments  General comments (skin integrity, edema, etc.) patient desaturate to mid 80s with functional ambulation in room and seated ther ex on 5L, quickly recovers to 90% with rest  Exercises  Exercises Other exercises  Other Exercises  Other Exercises seated elbow flexion and extension, shoulder diagonal pulls and flexiox, horizontal abduction and shoulder abduction, and shoulder press x15 reps with green theraband. educate patient on use of flutter valve patient reports using IS independently.   OT - End of Session  Equipment Utilized During Treatment Oxygen  Activity Tolerance Patient tolerated treatment well  Patient left in chair;with call bell/phone within reach  Nurse Communication Other (comment) (vitals, provided flutter valve)  OT Assessment  OT Recommendation/Assessment Patient does not need any further OT services  OT Visit Diagnosis Other abnormalities of gait and mobility (R26.89)  OT Problem List Cardiopulmonary status limiting activity  AM-PAC OT "6 Clicks" Daily Activity Outcome Measure (Version 2)  Help from another person eating meals? 4  Help from another person taking care of personal grooming? 4  Help from another person toileting, which includes using toliet, bedpan, or urinal? 4  Help from another  person bathing (including washing, rinsing, drying)? 4  Help from  another person to put on and taking off regular upper body clothing? 4  Help from another person to put on and taking off regular lower body clothing? 4  6 Click Score 24  OT Recommendation  Follow Up Recommendations No OT follow up  OT Equipment None recommended by OT  OT Time Calculation  OT Start Time (ACUTE ONLY) 1329  OT Stop Time (ACUTE ONLY) 1352  OT Time Calculation (min) 23 min  OT General Charges  $OT Visit 1 Visit  OT Evaluation  $OT Eval Low Complexity 1 Low  OT Treatments  $Therapeutic Exercise 8-22 mins  Written Expression  Dominant Hand Left   Marlyce Huge OT OT pager: [phone]

## 2020-07-26 NOTE — Progress Notes (Addendum)
PROGRESS NOTE    Ricardo Davis  TDD:[id number] DOB: 01/04/1961 DOA: 07/23/2020 PCP: Barbie Banner, MD    Brief Narrative:  Patient admitted to the hospital with a working diagnosis of acute hypoxic respiratory failure due to SARS COVID-19 viral pneumonia.  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 His symptoms were consistent with cough, fever, nausea and progressive dyspnea. His symptoms were persistent despite outpatient therapy with azithromycin and dexamethasone. He is not vaccinated for COVID-19. On his initial physical examination blood pressure 122/72, pulse rate 88, respiratory 28, oxygen saturation 92% on supplemental oxygen, temperature 99.1, his lungs had coarse breath sounds bilaterally, heart S1-S2, present rhythmic, soft abdomen, no lower extremity edema. Sodium 142, potassium 3.1, chloride 106, bicarb 25, glucose 134, BUN 22, creatinine 1.0, white count 8.1, hemoglobin 15.1, hematocrit 45.7, platelets 292. SARS COVID-19 positive. Chest radiograph with bilateral interstitial infiltrates, left lower lobe, left upper lobe and right upper lobe. EKG 83 bpm, left axis deviation, normal intervals, sinus rhythm, no ST segment or T wave changes, low voltage  Patient has been placed on IV steroids, remdesivir and baricitinib with good toleration.   If continue to improve possible discharge in 24 to 48 H  1. Acute hypoxic respiratory failure due to SARS COVID 19 viral pneumonia.  RR: 20  Pulse oxymetry: 91  Fi02: 5 L/min per [place]   COVID-19 Labs  Recent Labs    07/23/20 1531 07/23/20 1531 07/24/20 0452 07/25/20 0546 07/26/20 0522  DDIMER 0.74*   < > 0.60* 0.74* 0.66*  FERRITIN 859*   < > 741* 755* 727*  LDH 241*  --   --   --   --   CRP 5.3*   < > 12.4* 7.3* 2.2*   < > = values in this interval not displayed.    Lab Results  Component Value Date    SARSCOV2NAA POSITIVE (A) 07/23/2020   Patient is feeling better but not yet back to baseline, he has been out of bed to chair and ambulating in the room. Inflammatory markers are trending down.   Continue medical therapy with Remdesivir #4/5, systemic steroids (decrease methylprednisolone to daily) and baricitinib. On bronchodilators, antitussive agents and airway clearing techniques. Continue to encourage mobility.   Follow up inflammatory markers and oxygen requirements in am, for possible dc after his last dose of remdesivir.    2. Insomnia. Not able to sleep last night despite ambien, will change to trazodone.   3. Hypokalemia. Renal function and electrolytes stable, patient is tolerating po well. Today serum cr is 0,93 with K at 3,8 and bicarbonate at 22.  4. HTN. Blood pressure has remained controlled.   5, Steroid induced hyperglycemia/ Uncontrolled T2DM Hgb A1c 6.5 . Continue insulin sliding scale for glucose cover and monitoring. Decrease dose of steroids in am. Glucose has been 270 to 280's.   Assessment & Plan:   Principal Problem:   Pneumonia due to COVID-19 virus Active Problems:   Hypokalemia   Essential hypertension   Acute respiratory failure with hypoxia (HCC)   Steroid-induced hyperglycemia   Type 2 diabetes mellitus (HCC)     Status is: Inpatient  Remains inpatient appropriate because:IV treatments appropriate due to intensity of illness or inability to take PO   Dispo: The patient is from: Home              Anticipated d/c is to: Home  Anticipated d/c date is: 1 day              Patient currently is not medically stable to d/c. Possible dc home in am, pending oxygenation and inflammatory markers.    DVT prophylaxis: Enoxaparin   Code Status:   full  Family Communication:  I was not able to reach his wife over the phone, message left.      Nutrition Status: Nutrition Problem: Increased nutrient needs Etiology: acute illness (COVID-19  infection) Signs/Symptoms: estimated needs Interventions: Ensure Enlive (each supplement provides 350kcal and 20 grams of protein)     Subjective: Patient is feeling better, out of bed to chair and ambulating in the room, no nausea or vomiting, no chest pain.   Objective: Vitals:   07/25/20 1247 07/25/20 2121 07/26/20 0620 07/26/20 0815  BP: 122/72 (!) 147/78 132/79   Pulse: 87 83 71   Resp: 20 20 20    Temp: (!) 97.4 F (36.3 C) 97.6 F (36.4 C) (!) 97.2 F (36.2 C)   TempSrc: Axillary Oral Oral   SpO2: 92% 94% 92% 91%  Weight:      Height:        Intake/Output Summary (Last 24 hours) at 07/26/2020 0958 Last data filed at 07/25/2020 1800 Gross per 24 hour  Intake 580 ml  Output 200 ml  Net 380 ml   Filed Weights   07/23/20 1511 07/24/20 0112  Weight: 113.4 kg 109.1 kg    Examination:   General: Not in pain or dyspnea.  Neurology: Awake and alert, non focal  E ENT: no pallor, no icterus, oral mucosa moist Cardiovascular: No JVD. S1-S2 present, rhythmic, no gallops, rubs, or murmurs. No lower extremity edema. Pulmonary: positive breath sounds bilaterally, with no wheezing, rhonchi or rales. Gastrointestinal. Abdomen soft and non tender.  Skin. No rashes Musculoskeletal: no joint deformities     Data Reviewed: I have personally reviewed following labs and imaging studies  CBC: Recent Labs  Lab 07/23/20 1531 07/23/20 1721 07/24/20 0452  WBC 8.1 7.7 4.3  NEUTROABS 6.2  --  3.5  HGB 15.1 15.8 13.5  HCT 45.7 47.1 40.7  MCV 88.1 88.5 88.3  PLT 292 303 268   Basic Metabolic Panel: Recent Labs  Lab 07/23/20 1531 07/23/20 1721 07/24/20 0452 07/25/20 0546 07/26/20 0522  NA 142  --  141 140 137  K 3.1*  --  4.1 3.7 3.8  CL 106  --  106 107 103  CO2 25  --  23 23 22   GLUCOSE 134*  --  284* 271* 275*  BUN 22*  --  26* 32* 35*  CREATININE 1.02 1.11 1.03 0.93 0.93  CALCIUM 8.4*  --  8.9 9.0 8.9  MG  --  1.8 2.0  --   --   PHOS  --   --  3.3  --   --     GFR: Estimated Creatinine Clearance: 115.3 mL/min (by C-G formula based on SCr of 0.93 mg/dL). Liver Function Tests: Recent Labs  Lab 07/23/20 1531 07/24/20 0452 07/25/20 0546 07/26/20 0522  AST 32 27 23 22   ALT 42 38 34 37  ALKPHOS 63 57 58 54  BILITOT 0.5 0.3 0.4 0.3  PROT 6.8 6.3* 6.5 6.1*  ALBUMIN 3.2* 2.8* 2.9* 2.7*   No results for input(s): LIPASE, AMYLASE in the last 168 hours. No results for input(s): AMMONIA in the last 168 hours. Coagulation Profile: No results for input(s): INR, PROTIME in the last 168 hours. Cardiac  Enzymes: No results for input(s): CKTOTAL, CKMB, CKMBINDEX, TROPONINI in the last 168 hours. BNP (last 3 results) No results for input(s): PROBNP in the last 8760 hours. HbA1C: Recent Labs    07/24/20 0452  HGBA1C 6.5*   CBG: Recent Labs  Lab 07/25/20 1559 07/25/20 2123 07/26/20 0758  GLUCAP 352* 322* 272*   Lipid Profile: Recent Labs    07/23/20 1531 07/24/20 0452  CHOL  --  160  HDL  --  29*  LDLCALC  --  94  TRIG 285* 187*  CHOLHDL  --  5.5   Thyroid Function Tests: No results for input(s): TSH, T4TOTAL, FREET4, T3FREE, THYROIDAB in the last 72 hours. Anemia Panel: Recent Labs    07/25/20 0546 07/26/20 0522  FERRITIN 755* 727*      Radiology Studies: I have reviewed all of the imaging during this hospital visit personally     Scheduled Meds: . vitamin C  500 mg Oral Daily  . baricitinib  4 mg Oral Daily  . chlorpheniramine-HYDROcodone  5 mL Oral Q12H  . feeding supplement (ENSURE ENLIVE)  237 mL Oral BID BM  . hydrocortisone  25 mg Rectal BID  . insulin aspart  0-15 Units Subcutaneous TID WC  . insulin aspart  0-5 Units Subcutaneous QHS  . Ipratropium-Albuterol  1 puff Inhalation Q6H  . methylPREDNISolone (SOLU-MEDROL) injection  50 mg Intravenous Q12H  . zinc sulfate  220 mg Oral Daily  . zolpidem  5 mg Oral QHS   Continuous Infusions: . remdesivir 100 mg in NS 100 mL 100 mg (07/26/20 0926)     LOS: 3  days        Jahzier Villalon Annett Gula, MD

## 2020-07-27 DIAGNOSIS — E119 Type 2 diabetes mellitus without complications: Secondary | ICD-10-CM

## 2020-07-27 LAB — COMPREHENSIVE METABOLIC PANEL
ALT: 51 U/L — ABNORMAL HIGH (ref 0–44)
AST: 34 U/L (ref 15–41)
Albumin: 2.8 g/dL — ABNORMAL LOW (ref 3.5–5.0)
Alkaline Phosphatase: 55 U/L (ref 38–126)
Anion gap: 13 (ref 5–15)
BUN: 34 mg/dL — ABNORMAL HIGH (ref 6–20)
CO2: 23 mmol/L (ref 22–32)
Calcium: 8.7 mg/dL — ABNORMAL LOW (ref 8.9–10.3)
Chloride: 102 mmol/L (ref 98–111)
Creatinine, Ser: 0.97 mg/dL (ref 0.61–1.24)
GFR calc Af Amer: >60 mL/min (ref >60)
GFR calc non Af Amer: >60 mL/min (ref >60)
Glucose, Bld: 248 mg/dL — ABNORMAL HIGH (ref 70–99)
Potassium: 3.9 mmol/L (ref 3.5–5.1)
Sodium: 138 mmol/L (ref 135–145)
Total Bilirubin: 0.3 mg/dL (ref 0.3–1.2)
Total Protein: 6.1 g/dL — ABNORMAL LOW (ref 6.5–8.1)

## 2020-07-27 LAB — C-REACTIVE PROTEIN: CRP: 2.1 mg/dL — ABNORMAL HIGH (ref <1.0)

## 2020-07-27 LAB — CBC WITH DIFFERENTIAL/PLATELET
Abs Immature Granulocytes: 0.13 10*3/uL — ABNORMAL HIGH (ref 0.00–0.07)
Basophils Absolute: 0 10*3/uL (ref 0.0–0.1)
Basophils Relative: 0 %
Eosinophils Absolute: 0 10*3/uL (ref 0.0–0.5)
Eosinophils Relative: 0 %
HCT: 43.5 % (ref 39.0–52.0)
Hemoglobin: 14.2 g/dL (ref 13.0–17.0)
Immature Granulocytes: 1 %
Lymphocytes Relative: 6 %
Lymphs Abs: 0.6 10*3/uL — ABNORMAL LOW (ref 0.7–4.0)
MCH: 29.5 pg (ref 26.0–34.0)
MCHC: 32.6 g/dL (ref 30.0–36.0)
MCV: 90.2 fL (ref 80.0–100.0)
Monocytes Absolute: 0.5 10*3/uL (ref 0.1–1.0)
Monocytes Relative: 5 %
Neutro Abs: 8.5 10*3/uL — ABNORMAL HIGH (ref 1.7–7.7)
Neutrophils Relative %: 88 %
Platelets: 358 10*3/uL (ref 150–400)
RBC: 4.82 MIL/uL (ref 4.22–5.81)
RDW: 13.2 % (ref 11.5–15.5)
WBC: 9.7 10*3/uL (ref 4.0–10.5)
nRBC: 0 % (ref 0.0–0.2)

## 2020-07-27 LAB — LACTATE DEHYDROGENASE: LDH: 293 U/L — ABNORMAL HIGH (ref 98–192)

## 2020-07-27 LAB — MAGNESIUM: Magnesium: 2.3 mg/dL (ref 1.7–2.4)

## 2020-07-27 LAB — D-DIMER, QUANTITATIVE: D-Dimer, Quant: 1.43 ug/mL-FEU — ABNORMAL HIGH (ref 0.00–0.50)

## 2020-07-27 LAB — GLUCOSE, CAPILLARY
Glucose-Capillary: 247 mg/dL — ABNORMAL HIGH (ref 70–99)
Glucose-Capillary: 324 mg/dL — ABNORMAL HIGH (ref 70–99)
Glucose-Capillary: 227 mg/dL — ABNORMAL HIGH (ref 70–99)
Glucose-Capillary: 313 mg/dL — ABNORMAL HIGH (ref 70–99)

## 2020-07-27 LAB — FERRITIN: Ferritin: 625 ng/mL — ABNORMAL HIGH (ref 24–336)

## 2020-07-27 LAB — PHOSPHORUS: Phosphorus: 4.7 mg/dL — ABNORMAL HIGH (ref 2.5–4.6)

## 2020-07-27 MED ORDER — TRAZODONE HCL 50 MG PO TABS
50.0000 mg | ORAL_TABLET | Freq: Every evening | ORAL | Status: DC | PRN
  Administered 2020-07-27: 50 mg via ORAL
  Filled 2020-07-27 (×2): qty 1

## 2020-07-27 MED ORDER — LIVING WELL WITH DIABETES BOOK
Freq: Once | Status: AC
  Filled 2020-07-27: qty 1

## 2020-07-27 MED ORDER — INSULIN DETEMIR 100 UNIT/ML ~~LOC~~ SOLN
10.0000 [IU] | Freq: Every day | SUBCUTANEOUS | Status: DC
  Administered 2020-07-27 – 2020-07-29 (×3): 10 [IU] via SUBCUTANEOUS
  Filled 2020-07-27 (×3): qty 0.1

## 2020-07-27 MED ORDER — IPRATROPIUM-ALBUTEROL 20-100 MCG/ACT IN AERS: 1.0000 | INHALATION_SPRAY | Freq: Four times a day (QID) | RESPIRATORY_TRACT | Status: DC | PRN

## 2020-07-27 MED ORDER — ENOXAPARIN SODIUM 60 MG/0.6ML ~~LOC~~ SOLN: 55.0000 mg | SUBCUTANEOUS | Status: DC

## 2020-07-27 MED ORDER — ENOXAPARIN SODIUM 40 MG/0.4ML ~~LOC~~ SOLN
40.0000 mg | SUBCUTANEOUS | Status: DC
  Administered 2020-07-27 – 2020-07-28 (×2): 40 mg via SUBCUTANEOUS
  Filled 2020-07-27 (×2): qty 0.4

## 2020-07-27 MED ORDER — IPRATROPIUM-ALBUTEROL 20-100 MCG/ACT IN AERS
1.0000 | INHALATION_SPRAY | Freq: Four times a day (QID) | RESPIRATORY_TRACT | Status: DC
  Administered 2020-07-27 – 2020-07-29 (×8): 1 via RESPIRATORY_TRACT
  Filled 2020-07-27: qty 4

## 2020-07-27 NOTE — TOC Progression Note (Signed)
Transition of Care [facility]) - Progression Note    Patient Details  Name: Ricardo Davis MRN: [id number] Date of Birth: 11/16/1960  Transition of Care [facility]) CM/SW Contact  Geni Bers, RN Phone Number: 07/27/2020, 2:09 PM  Clinical Narrative:     TOC will continue to follow for discharge needs. Pt is from home with spouse.   Expected Discharge Plan: Home/Self Care Barriers to Discharge: No Barriers Identified  Expected Discharge Plan and Services Expected Discharge Plan: Home/Self Care       Living arrangements for the past 2 months: Single Family Home                                       Social Determinants of Health (SDOH) Interventions    Readmission Risk Interventions No flowsheet data found.

## 2020-07-27 NOTE — Plan of Care (Signed)
°  Problem: Clinical Measurements: °Goal: Ability to maintain clinical measurements within normal limits will improve °Outcome: Progressing °Goal: Respiratory complications will improve °Outcome: Progressing °Goal: Cardiovascular complication will be avoided °Outcome: Progressing °  °Problem: Activity: °Goal: Risk for activity intolerance will decrease °Outcome: Progressing °  °Problem: Nutrition: °Goal: Adequate nutrition will be maintained °Outcome: Progressing °  °Problem: Pain Managment: °Goal: General experience of comfort will improve °Outcome: Progressing °  °Problem: Safety: °Goal: Ability to remain free from injury will improve °Outcome: Progressing °  °Problem: Skin Integrity: °Goal: Risk for impaired skin integrity will decrease °Outcome: Progressing °  °

## 2020-07-27 NOTE — Progress Notes (Addendum)
Lovenox per Pharmacy for DVT Prophylaxis  Pharmacy has been consulted from dosing enoxaparin (lovenox) in this patient for DVT prophylaxis.    Hgb/Hct = 14.2/43.5 Pltc = 358 SCr = 0.97  Wt = 109.1 kg HT = 74" BMI = 30.88  Assessment: No adjustment as BMI < 35 in +Covid patient per protocol  Plan: Enoxaparin 40 mg sq q24h for VTE prophylaxis Need for further dosage adjustment appears unlikely at present.    Will sign off at this time.  Please reconsult if a change in clinical status warrants re-evaluation of dosage.  Thank you, Terrilee Files, PharmD 07/27/20 @ 17:01

## 2020-07-27 NOTE — Progress Notes (Signed)
PROGRESS NOTE    Ricardo Davis  IDP:[id number] DOB: Jul 18, 1961 DOA: 07/23/2020 PCP: Barbie Banner, MD     Brief Narrative:  Patient admitted to the hospital with a working diagnosis of acute hypoxic respiratory failure due to SARS COVID-19 viral pneumonia.  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 His symptoms were consistent with cough, fever, nausea and progressive dyspnea. His symptoms were persistent despite outpatient therapy with azithromycin and dexamethasone. He is not vaccinated for COVID-19. On his initial physical examination blood pressure 122/72, pulse rate 88, respiratory 28, oxygen saturation 92% on supplemental oxygen, temperature 99.1, his lungs had coarse breath sounds bilaterally, heart S1-S2, present rhythmic, soft abdomen, no lower extremity edema. Sodium 142, potassium 3.1, chloride 106, bicarb 25, glucose 134, BUN 22, creatinine 1.0, white count 8.1, hemoglobin 15.1, hematocrit 45.7, platelets 292. SARS COVID-19 positive. Chest radiograph with bilateral interstitial infiltrates, left lower lobe, left upper lobe and right upper lobe. EKG 83 bpm, left axis deviation, normal intervals, sinus rhythm, no ST segment or T wave changes, low voltage  Patient has been placed on IV steroids, remdesivir and baricitinib with good toleration.   If continue to improve possible discharge in 24 to 48 H   Subjective: Afebrile overnight   Assessment & Plan: Covid vaccination; not vaccinated   Principal Problem:   Pneumonia due to COVID-19 virus Active Problems:   Hypokalemia   Essential hypertension   Acute respiratory failure with hypoxia (HCC)   Steroid-induced hyperglycemia   Type 2 diabetes mellitus (HCC)   Diabetes mellitus type 2, controlled, without complications (HCC)   Acute respiratory failure with hypoxia/Covid pneumonia COVID-19 Labs  Recent Labs    07/25/20 0546 07/26/20 0522  07/27/20 0401 07/27/20 0855  DDIMER 0.74* 0.66* 1.43*  --   FERRITIN 755* 727* 625*  --   LDH  --   --   --  293*  CRP 7.3* 2.2* 2.1*  --     Lab Results  Component Value Date   SARSCOV2NAA POSITIVE (A) 07/23/2020  -Baricitinib per pharmacy protocol x10 days -Solu-Medrol 40 mg daily -Remdesivir x5 days per pharmacy protocol -Vitamin C and zinc per Covid protocol -Respimat QID -9/29 ambulatory SPO2 pending  Insomnia -Trazodone 50 mg PRN QHS  Hypokalemia -Potassium goal> 4 -Resolved  Essential HTN -Currently controlled  DM type II controlled without complication/steroid-induced hyperglycemia -Hemoglobin A1c= 6.5 -9/29 Levemir 10 units daily -Moderate SSI     DVT prophylaxis: Lovenox Code Status: Full Family Communication:  Status is: Inpatient    Dispo: The patient is from: Home              Anticipated d/c is to: Home              Anticipated d/c date is: 9/30              Patient currently unstable      Consultants:    Procedures/Significant Events:    I have personally reviewed and interpreted all radiology studies and my findings are as above.  VENTILATOR SETTINGS: Nasal Canula 9/29 Flow; SPO2; 91%    Cultures   Antimicrobials: Anti-infectives (From admission, onward)   Start     Dose/Rate Route Frequency Ordered Stop   07/24/20 1000  remdesivir 100 mg in sodium chloride 0.9 % 100 mL IVPB        100 mg 200 mL/hr over 30 Minutes Intravenous Daily 07/23/20 1735 07/27/20 1329  07/23/20 1800  remdesivir 200 mg in sodium chloride 0.9% 250 mL IVPB        200 mg 580 mL/hr over 30 Minutes Intravenous Once 07/23/20 1734 07/23/20 2000       Devices    LINES / TUBES:      Continuous Infusions:    Objective: Vitals:   07/26/20 1225 07/26/20 2103 07/27/20 0513 07/27/20 1446  BP: 120/70 133/78 124/77 138/88  Pulse: 81 71 69 90  Resp: 18 20 18 18   Temp: (!) 97.3 F (36.3 C) 97.9 F (36.6 C) (!) 97.5 F (36.4 C) 97.6 F  (36.4 C)  TempSrc: Oral Oral Oral Axillary  SpO2: 91% 94% 91% 91%  Weight:      Height:        Intake/Output Summary (Last 24 hours) at 07/27/2020 1634 Last data filed at 07/27/2020 1448 Gross per 24 hour  Intake 602.5 ml  Output 800 ml  Net -197.5 ml   Filed Weights   07/23/20 1511 07/24/20 0112  Weight: 113.4 kg 109.1 kg    Examination:  General: A/O x4, positive acute respiratory distress Eyes: negative scleral hemorrhage, negative anisocoria, negative icterus ENT: Negative Runny nose, negative gingival bleeding, Neck:  Negative scars, masses, torticollis, lymphadenopathy, JVD Lungs: decreased breath sounds bilaterally without wheezes or crackles Cardiovascular: Regular rate and rhythm without murmur gallop or rub normal S1 and S2 Abdomen: negative abdominal pain, nondistended, positive soft, bowel sounds, no rebound, no ascites, no appreciable mass Extremities: No significant cyanosis, clubbing, or edema bilateral lower extremities Skin: Negative rashes, lesions, ulcers Psychiatric:  Negative depression, negative anxiety, negative fatigue, negative mania  Central nervous system:  Cranial nerves II through XII intact, tongue/uvula midline, all extremities muscle strength 5/5, sensation intact throughout, negative dysarthria, negative expressive aphasia, negative receptive aphasia.  .     Data Reviewed: Care during the described time interval was provided by me .  I have reviewed this patient's available data, including medical history, events of note, physical examination, and all test results as part of my evaluation.  CBC: Recent Labs  Lab 07/23/20 1531 07/23/20 1721 07/24/20 0452 07/27/20 0855  WBC 8.1 7.7 4.3 9.7  NEUTROABS 6.2  --  3.5 8.5*  HGB 15.1 15.8 13.5 14.2  HCT 45.7 47.1 40.7 43.5  MCV 88.1 88.5 88.3 90.2  PLT 292 303 268 358   Basic Metabolic Panel: Recent Labs  Lab 07/23/20 1531 07/23/20 1531 07/23/20 1721 07/24/20 0452 07/25/20 0546  07/26/20 0522 07/27/20 0401 07/27/20 0855  NA 142  --   --  141 140 137 138  --   K 3.1*  --   --  4.1 3.7 3.8 3.9  --   CL 106  --   --  106 107 103 102  --   CO2 25  --   --  23 23 22 23   --   GLUCOSE 134*  --   --  284* 271* 275* 248*  --   BUN 22*  --   --  26* 32* 35* 34*  --   CREATININE 1.02   < > 1.11 1.03 0.93 0.93 0.97  --   CALCIUM 8.4*  --   --  8.9 9.0 8.9 8.7*  --   MG  --   --  1.8 2.0  --   --   --  2.3  PHOS  --   --   --  3.3  --   --   --  4.7*   < > =  values in this interval not displayed.   GFR: Estimated Creatinine Clearance: 110.5 mL/min (by C-G formula based on SCr of 0.97 mg/dL). Liver Function Tests: Recent Labs  Lab 07/23/20 1531 07/24/20 0452 07/25/20 0546 07/26/20 0522 07/27/20 0401  AST 32 27 23 22  34  ALT 42 38 34 37 51*  ALKPHOS 63 57 58 54 55  BILITOT 0.5 0.3 0.4 0.3 0.3  PROT 6.8 6.3* 6.5 6.1* 6.1*  ALBUMIN 3.2* 2.8* 2.9* 2.7* 2.8*   No results for input(s): LIPASE, AMYLASE in the last 168 hours. No results for input(s): AMMONIA in the last 168 hours. Coagulation Profile: No results for input(s): INR, PROTIME in the last 168 hours. Cardiac Enzymes: No results for input(s): CKTOTAL, CKMB, CKMBINDEX, TROPONINI in the last 168 hours. BNP (last 3 results) No results for input(s): PROBNP in the last 8760 hours. HbA1C: No results for input(s): HGBA1C in the last 72 hours. CBG: Recent Labs  Lab 07/26/20 1223 07/26/20 1622 07/26/20 2100 07/27/20 0828 07/27/20 1142  GLUCAP 284* 275* 255* 247* 324*   Lipid Profile: No results for input(s): CHOL, HDL, LDLCALC, TRIG, CHOLHDL, LDLDIRECT in the last 72 hours. Thyroid Function Tests: No results for input(s): TSH, T4TOTAL, FREET4, T3FREE, THYROIDAB in the last 72 hours. Anemia Panel: Recent Labs    07/26/20 0522 07/27/20 0401  FERRITIN 727* 625*   Sepsis Labs: Recent Labs  Lab 07/23/20 1531 07/23/20 1614  PROCALCITON 0.28  --   LATICACIDVEN  --  1.4    Recent Results (from the  past 240 hour(s))  SARS Coronavirus 2 by RT PCR (hospital order, performed in [facility] hospital lab) Nasopharyngeal Nasopharyngeal Swab     Status: Abnormal   Collection Time: 07/23/20  3:31 PM   Specimen: Nasopharyngeal Swab  Result Value Ref Range Status   SARS Coronavirus 2 POSITIVE (A) NEGATIVE Final    Comment: RESULT CALLED TO, READ BACK BY AND VERIFIED WITH: HALL,C. RN AT 1745 07/23/20 MULLINS,T (NOTE) SARS-CoV-2 target nucleic acids are DETECTED  SARS-CoV-2 RNA is generally detectable in upper respiratory specimens  during the acute phase of infection.  Positive results are indicative  of the presence of the identified virus, but do not rule out bacterial infection or co-infection with other pathogens not detected by the test.  Clinical correlation with patient history and  other diagnostic information is necessary to determine patient infection status.  The expected result is negative.  Fact Sheet for Patients:   BoilerBrush.com.cyhttps://www.fda.gov/media/136312/download   Fact Sheet for Healthcare Providers:   https://pope.com/https://www.fda.gov/media/136313/download    This test is not yet approved or cleared by the [place] FDA and  has been authorized for detection and/or diagnosis of SARS-CoV-2 by FDA under an Emergency Use Authorization (EUA).  This EUA will remain in effect (meaning this  test can be used) for the duration of  the COVID-19 declaration under Section 564(b)(1) of the Act, 21 U.S.C. section 360-bbb-3(b)(1), unless the authorization is terminated or revoked sooner.  Performed at [facility], [address] [place], [place] [address]   Blood Culture (routine x 2)     Status: None (Preliminary result)   Collection Time: 07/23/20  4:14 PM   Specimen: BLOOD  Result Value Ref Range Status   Specimen Description   Final    BLOOD RIGHT ANTECUBITAL Performed at [facility], [address] [place], [place] [address]    Special Requests    Final    BOTTLES DRAWN AEROBIC AND ANAEROBIC Blood Culture  adequate volume Performed at [facility], [address], [place], [place] [address]    Culture   Final    NO GROWTH 4 DAYS Performed at [facility], [address], [place], [place] [address]    Report Status PENDING  Incomplete         Radiology Studies: No results found.      Scheduled Meds: . vitamin C  500 mg Oral Daily  . baricitinib  4 mg Oral Daily  . chlorpheniramine-HYDROcodone  5 mL Oral Q12H  . feeding supplement (ENSURE ENLIVE)  237 mL Oral BID BM  . hydrocortisone  25 mg Rectal BID  . insulin aspart  0-15 Units Subcutaneous TID WC  . insulin aspart  0-5 Units Subcutaneous QHS  . insulin detemir  10 Units Subcutaneous Daily  . Ipratropium-Albuterol  1 puff Inhalation QID  . living well with diabetes book   Does not apply Once  . methylPREDNISolone (SOLU-MEDROL) injection  40 mg Intravenous Daily  . zinc sulfate  220 mg Oral Daily  . zolpidem  5 mg Oral QHS   Continuous Infusions:    LOS: 4 days    Time spent:40 min    Yocheved Depner, Roselind Messier, MD [facility] Pager [phone]  If 7PM-7AM, please contact night-coverage [email or web address] Password [facility] 07/27/2020, 4:34 PM

## 2020-07-28 LAB — CBC WITH DIFFERENTIAL/PLATELET
Abs Immature Granulocytes: 0.36 10*3/uL — ABNORMAL HIGH (ref 0.00–0.07)
Basophils Absolute: 0 10*3/uL (ref 0.0–0.1)
Basophils Relative: 0 %
Eosinophils Absolute: 0.2 10*3/uL (ref 0.0–0.5)
Eosinophils Relative: 2 %
HCT: 42.9 % (ref 39.0–52.0)
Hemoglobin: 13.8 g/dL (ref 13.0–17.0)
Immature Granulocytes: 4 %
Lymphocytes Relative: 12 %
Lymphs Abs: 1.1 10*3/uL (ref 0.7–4.0)
MCH: 29.1 pg (ref 26.0–34.0)
MCHC: 32.2 g/dL (ref 30.0–36.0)
MCV: 90.5 fL (ref 80.0–100.0)
Monocytes Absolute: 0.6 10*3/uL (ref 0.1–1.0)
Monocytes Relative: 6 %
Neutro Abs: 7.3 10*3/uL (ref 1.7–7.7)
Neutrophils Relative %: 76 %
Platelets: 346 10*3/uL (ref 150–400)
RBC: 4.74 MIL/uL (ref 4.22–5.81)
RDW: 13.2 % (ref 11.5–15.5)
WBC: 9.6 10*3/uL (ref 4.0–10.5)
nRBC: 0 % (ref 0.0–0.2)

## 2020-07-28 LAB — MAGNESIUM: Magnesium: 2.5 mg/dL — ABNORMAL HIGH (ref 1.7–2.4)

## 2020-07-28 LAB — FERRITIN: Ferritin: 566 ng/mL — ABNORMAL HIGH (ref 24–336)

## 2020-07-28 LAB — PHOSPHORUS: Phosphorus: 5.3 mg/dL — ABNORMAL HIGH (ref 2.5–4.6)

## 2020-07-28 LAB — COMPREHENSIVE METABOLIC PANEL
ALT: 44 U/L (ref 0–44)
AST: 24 U/L (ref 15–41)
Albumin: 3.1 g/dL — ABNORMAL LOW (ref 3.5–5.0)
Alkaline Phosphatase: 69 U/L (ref 38–126)
Anion gap: 13 (ref 5–15)
BUN: 34 mg/dL — ABNORMAL HIGH (ref 6–20)
CO2: 27 mmol/L (ref 22–32)
Calcium: 9.3 mg/dL (ref 8.9–10.3)
Chloride: 102 mmol/L (ref 98–111)
Creatinine, Ser: 1.16 mg/dL (ref 0.61–1.24)
GFR calc Af Amer: >60 mL/min (ref >60)
GFR calc non Af Amer: >60 mL/min (ref >60)
Glucose, Bld: 163 mg/dL — ABNORMAL HIGH (ref 70–99)
Potassium: 4.1 mmol/L (ref 3.5–5.1)
Sodium: 142 mmol/L (ref 135–145)
Total Bilirubin: 0.7 mg/dL (ref 0.3–1.2)
Total Protein: 6.6 g/dL (ref 6.5–8.1)

## 2020-07-28 LAB — CULTURE, BLOOD (ROUTINE X 2)
Culture: NO GROWTH
Special Requests: ADEQUATE

## 2020-07-28 LAB — GLUCOSE, CAPILLARY
Glucose-Capillary: 152 mg/dL — ABNORMAL HIGH (ref 70–99)
Glucose-Capillary: 272 mg/dL — ABNORMAL HIGH (ref 70–99)
Glucose-Capillary: 310 mg/dL — ABNORMAL HIGH (ref 70–99)
Glucose-Capillary: 190 mg/dL — ABNORMAL HIGH (ref 70–99)

## 2020-07-28 LAB — C-REACTIVE PROTEIN: CRP: 2.4 mg/dL — ABNORMAL HIGH (ref <1.0)

## 2020-07-28 LAB — LACTATE DEHYDROGENASE: LDH: 270 U/L — ABNORMAL HIGH (ref 98–192)

## 2020-07-28 LAB — D-DIMER, QUANTITATIVE: D-Dimer, Quant: 1.52 ug/mL-FEU — ABNORMAL HIGH (ref 0.00–0.50)

## 2020-07-28 MED ORDER — INSULIN ASPART 100 UNIT/ML ~~LOC~~ SOLN
10.0000 [IU] | Freq: Three times a day (TID) | SUBCUTANEOUS | Status: DC
  Administered 2020-07-29: 10 [IU] via SUBCUTANEOUS

## 2020-07-28 NOTE — Progress Notes (Signed)
SATURATION QUALIFICATIONS: (This note is used to comply with regulatory documentation for home oxygen)  Patient Saturations on Room Air at Rest = 89-90%  Patient Saturations on Room Air while Ambulating = 80%  Patient Saturations on 4 Liters of oxygen while Ambulating = 89-90%  Please briefly explain why patient needs home oxygen: unable to maintain O2 on RA, ambulation on 2L drop to 80%, Covid recovery.

## 2020-07-28 NOTE — Progress Notes (Signed)
PROGRESS NOTE    Ricardo Davis  OFB:[id number] DOB: 11-27-1960 DOA: 07/23/2020 PCP: Barbie Banner, MD     Brief Narrative:  Patient admitted to the hospital with a working diagnosis of acute hypoxic respiratory failure due to SARS COVID-19 viral pneumonia.  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 His symptoms were consistent with cough, fever, nausea and progressive dyspnea. His symptoms were persistent despite outpatient therapy with azithromycin and dexamethasone. He is not vaccinated for COVID-19. On his initial physical examination blood pressure 122/72, pulse rate 88, respiratory 28, oxygen saturation 92% on supplemental oxygen, temperature 99.1, his lungs had coarse breath sounds bilaterally, heart S1-S2, present rhythmic, soft abdomen, no lower extremity edema. Sodium 142, potassium 3.1, chloride 106, bicarb 25, glucose 134, BUN 22, creatinine 1.0, white count 8.1, hemoglobin 15.1, hematocrit 45.7, platelets 292. SARS COVID-19 positive. Chest radiograph with bilateral interstitial infiltrates, left lower lobe, left upper lobe and right upper lobe. EKG 83 bpm, left axis deviation, normal intervals, sinus rhythm, no ST segment or T wave changes, low voltage  Patient has been placed on IV steroids, remdesivir and baricitinib with good toleration.   If continue to improve possible discharge in 24 to 48 H   Subjective: 9/30, positive SOB, but improving.  Negative CP, negative nausea, negative vomiting.      Assessment & Plan: Covid vaccination; not vaccinated   Principal Problem:   Pneumonia due to COVID-19 virus Active Problems:   Hypokalemia   Essential hypertension   Acute respiratory failure with hypoxia (HCC)   Steroid-induced hyperglycemia   Type 2 diabetes mellitus (HCC)   Diabetes mellitus type 2, controlled, without complications (HCC)   Acute respiratory failure with hypoxia/Covid  pneumonia COVID-19 Labs  Recent Labs    07/26/20 0522 07/27/20 0401 07/27/20 0855 07/28/20 0805  DDIMER 0.66* 1.43*  --  1.52*  FERRITIN 727* 625*  --  566*  LDH  --   --  293* 270*  CRP 2.2* 2.1*  --  2.4*    Lab Results  Component Value Date   SARSCOV2NAA POSITIVE (A) 07/23/2020  -Baricitinib per pharmacy protocol x10 days -Solu-Medrol 40 mg daily -Remdesivir x5 days per pharmacy protocol -Vitamin C and zinc per Covid protocol -Respimat QID SATURATION QUALIFICATIONS: (This note is used to comply with regulatory documentation for home oxygen) Patient Saturations on Room Air at Rest = 89-90% Patient Saturations on Room Air while Ambulating = 80% Patient Saturations on 4 Liters of oxygen while Ambulating = 89-90% Please briefly explain why patient needs home oxygen: unable to maintain O2 on RA, ambulation on 2L drop to 80%, Covid recovery -Patient meets criteria for home O2 -4 L O2 titrate to maintain SPO2> 88% -Provide Inogen home O2 portable concentrator  Insomnia -Trazodone 50 mg PRN QHS  Hypokalemia -Potassium goal> 4 -Resolved  Essential HTN -Currently controlled  DM type II controlled without complication/steroid-induced hyperglycemia -9/26 Hemoglobin A1c= 6.5 -9/29 Levemir 10 units daily -9/30 NovoLog 10 units qac -Moderate SSI     DVT prophylaxis: Lovenox Code Status: Full Family Communication:  Status is: Inpatient    Dispo: The patient is from: Home              Anticipated d/c is to: Home              Anticipated d/c date is: 10/1              Patient currently unstable  Consultants:    Procedures/Significant Events:    I have personally reviewed and interpreted all radiology studies and my findings are as above.  VENTILATOR SETTINGS: Nasal Canula 9/30 Flow; 4 L/min SPO2; 92%     Cultures   Antimicrobials: Anti-infectives (From admission, onward)   Start     Dose/Rate Route Frequency Ordered Stop   07/24/20 1000   remdesivir 100 mg in sodium chloride 0.9 % 100 mL IVPB        100 mg 200 mL/hr over 30 Minutes Intravenous Daily 07/23/20 1735 07/27/20 1329   07/23/20 1800  remdesivir 200 mg in sodium chloride 0.9% 250 mL IVPB        200 mg 580 mL/hr over 30 Minutes Intravenous Once 07/23/20 1734 07/23/20 2000       Devices    LINES / TUBES:      Continuous Infusions:    Objective: Vitals:   07/28/20 0638 07/28/20 1258 07/28/20 1259 07/28/20 1434  BP: 111/76   (!) 143/80  Pulse: (!) 51   82  Resp: 20   13  Temp: (!) 97.4 F (36.3 C)   97.9 F (36.6 C)  TempSrc: Oral   Oral  SpO2: 91% 90% 90% 92%  Weight:      Height:        Intake/Output Summary (Last 24 hours) at 07/28/2020 1641 Last data filed at 07/27/2020 2200 Gross per 24 hour  Intake 200 ml  Output --  Net 200 ml   Filed Weights   07/23/20 1511 07/24/20 0112  Weight: 113.4 kg 109.1 kg    Examination:  General: A/O x4, positive acute respiratory distress Eyes: negative scleral hemorrhage, negative anisocoria, negative icterus ENT: Negative Runny nose, negative gingival bleeding, Neck:  Negative scars, masses, torticollis, lymphadenopathy, JVD Lungs: decreased breath sounds bilaterally without wheezes or crackles Cardiovascular: Regular rate and rhythm without murmur gallop or rub normal S1 and S2 Abdomen: negative abdominal pain, nondistended, positive soft, bowel sounds, no rebound, no ascites, no appreciable mass Extremities: No significant cyanosis, clubbing, or edema bilateral lower extremities Skin: Negative rashes, lesions, ulcers Psychiatric:  Negative depression, negative anxiety, negative fatigue, negative mania  Central nervous system:  Cranial nerves II through XII intact, tongue/uvula midline, all extremities muscle strength 5/5, sensation intact throughout, negative dysarthria, negative expressive aphasia, negative receptive aphasia.  .     Data Reviewed: Care during the described time interval was  provided by me .  I have reviewed this patient's available data, including medical history, events of note, physical examination, and all test results as part of my evaluation.  CBC: Recent Labs  Lab 07/23/20 1531 07/23/20 1721 07/24/20 0452 07/27/20 0855 07/28/20 0805  WBC 8.1 7.7 4.3 9.7 9.6  NEUTROABS 6.2  --  3.5 8.5* 7.3  HGB 15.1 15.8 13.5 14.2 13.8  HCT 45.7 47.1 40.7 43.5 42.9  MCV 88.1 88.5 88.3 90.2 90.5  PLT 292 303 268 358 346   Basic Metabolic Panel: Recent Labs  Lab 07/23/20 1531 07/23/20 1721 07/24/20 0452 07/25/20 0546 07/26/20 0522 07/27/20 0401 07/27/20 0855 07/28/20 0805  NA   < >  --  141 140 137 138  --  142  K   < >  --  4.1 3.7 3.8 3.9  --  4.1  CL   < >  --  106 107 103 102  --  102  CO2   < >  --  23 23 22 23   --  27  GLUCOSE   < >  --  284* 271* 275* 248*  --  163*  BUN   < >  --  26* 32* 35* 34*  --  34*  CREATININE   < > 1.11 1.03 0.93 0.93 0.97  --  1.16  CALCIUM   < >  --  8.9 9.0 8.9 8.7*  --  9.3  MG  --  1.8 2.0  --   --   --  2.3 2.5*  PHOS  --   --  3.3  --   --   --  4.7* 5.3*   < > = values in this interval not displayed.   GFR: Estimated Creatinine Clearance: 92.4 mL/min (by C-G formula based on SCr of 1.16 mg/dL). Liver Function Tests: Recent Labs  Lab 07/24/20 0452 07/25/20 0546 07/26/20 0522 07/27/20 0401 07/28/20 0805  AST 27 23 22  34 24  ALT 38 34 37 51* 44  ALKPHOS 57 58 54 55 69  BILITOT 0.3 0.4 0.3 0.3 0.7  PROT 6.3* 6.5 6.1* 6.1* 6.6  ALBUMIN 2.8* 2.9* 2.7* 2.8* 3.1*   No results for input(s): LIPASE, AMYLASE in the last 168 hours. No results for input(s): AMMONIA in the last 168 hours. Coagulation Profile: No results for input(s): INR, PROTIME in the last 168 hours. Cardiac Enzymes: No results for input(s): CKTOTAL, CKMB, CKMBINDEX, TROPONINI in the last 168 hours. BNP (last 3 results) No results for input(s): PROBNP in the last 8760 hours. HbA1C: No results for input(s): HGBA1C in the last 72  hours. CBG: Recent Labs  Lab 07/27/20 1142 07/27/20 1700 07/27/20 2154 07/28/20 0809 07/28/20 1150  GLUCAP 324* 227* 313* 152* 272*   Lipid Profile: No results for input(s): CHOL, HDL, LDLCALC, TRIG, CHOLHDL, LDLDIRECT in the last 72 hours. Thyroid Function Tests: No results for input(s): TSH, T4TOTAL, FREET4, T3FREE, THYROIDAB in the last 72 hours. Anemia Panel: Recent Labs    07/27/20 0401 07/28/20 0805  FERRITIN 625* 566*   Sepsis Labs: Recent Labs  Lab 07/23/20 1531 07/23/20 1614  PROCALCITON 0.28  --   LATICACIDVEN  --  1.4    Recent Results (from the past 240 hour(s))  SARS Coronavirus 2 by RT PCR (hospital order, performed in [facility] hospital lab) Nasopharyngeal Nasopharyngeal Swab     Status: Abnormal   Collection Time: 07/23/20  3:31 PM   Specimen: Nasopharyngeal Swab  Result Value Ref Range Status   SARS Coronavirus 2 POSITIVE (A) NEGATIVE Final    Comment: RESULT CALLED TO, READ BACK BY AND VERIFIED WITH: HALL,C. RN AT 1745 07/23/20 MULLINS,T (NOTE) SARS-CoV-2 target nucleic acids are DETECTED  SARS-CoV-2 RNA is generally detectable in upper respiratory specimens  during the acute phase of infection.  Positive results are indicative  of the presence of the identified virus, but do not rule out bacterial infection or co-infection with other pathogens not detected by the test.  Clinical correlation with patient history and  other diagnostic information is necessary to determine patient infection status.  The expected result is negative.  Fact Sheet for Patients:   07/25/20   Fact Sheet for Healthcare Providers:   BoilerBrush.com.cy    This test is not yet approved or cleared by the https://pope.com/ FDA and  has been authorized for detection and/or diagnosis of SARS-CoV-2 by FDA under an Emergency Use Authorization (EUA).  This EUA will remain in effect (meaning this  test can be used) for the  duration of  the COVID-19 declaration under Section 564(b)(1) of the Act, 21 U.S.C. section  360-bbb-3(b)(1), unless the authorization is terminated or revoked sooner.  Performed at [facility], [address] [place], [place] [address]   Blood Culture (routine x 2)     Status: None   Collection Time: 07/23/20  4:14 PM   Specimen: BLOOD  Result Value Ref Range Status   Specimen Description   Final    BLOOD RIGHT ANTECUBITAL Performed at [facility], [address] [place], [place] [address]    Special Requests   Final    BOTTLES DRAWN AEROBIC AND ANAEROBIC Blood Culture adequate volume Performed at [facility], [address] [place], [place] [address]    Culture   Final    NO GROWTH 5 DAYS Performed at [facility], [address] [place], [place] [address]    Report Status 07/28/2020 FINAL  Final         Radiology Studies: No results found.      Scheduled Meds: . vitamin C  500 mg Oral Daily  . baricitinib  4 mg Oral Daily  . chlorpheniramine-HYDROcodone  5 mL Oral Q12H  . enoxaparin (LOVENOX) injection  40 mg Subcutaneous Q24H  . feeding supplement (ENSURE ENLIVE)  237 mL Oral BID BM  . hydrocortisone  25 mg Rectal BID  . insulin aspart  0-15 Units Subcutaneous TID WC  . insulin aspart  0-5 Units Subcutaneous QHS  . insulin detemir  10 Units Subcutaneous Daily  . Ipratropium-Albuterol  1 puff Inhalation QID  . methylPREDNISolone (SOLU-MEDROL) injection  40 mg Intravenous Daily  . zinc sulfate  220 mg Oral Daily  . zolpidem  5 mg Oral QHS   Continuous Infusions:    LOS: 5 days    Time spent:40 min    Ayanna Gheen, Roselind MessierURTIS J, MD [facility] Pager [phone]  If 7PM-7AM, please contact night-coverage [email or web address] Password [email or web address] 07/28/2020, 4:41 PM

## 2020-07-28 NOTE — Plan of Care (Signed)
No signs or symptoms of sepsis at this time. Problem: Clinical Measurements: Goal: Ability to maintain clinical measurements within normal limits will improve Outcome: Progressing Goal: Respiratory complications will improve Outcome: Progressing Goal: Cardiovascular complication will be avoided Outcome: Progressing   Problem: Activity: Goal: Risk for activity intolerance will decrease Outcome: Progressing   Problem: Nutrition: Goal: Adequate nutrition will be maintained Outcome: Progressing   Problem: Pain Managment: Goal: General experience of comfort will improve Outcome: Progressing   Problem: Safety: Goal: Ability to remain free from injury will improve Outcome: Progressing   Problem: Skin Integrity: Goal: Risk for impaired skin integrity will decrease Outcome: Progressing

## 2020-07-28 NOTE — Plan of Care (Signed)
  Problem: Clinical Measurements: Goal: Ability to maintain clinical measurements within normal limits will improve Outcome: Progressing   Problem: Activity: Goal: Risk for activity intolerance will decrease Outcome: Progressing   Problem: Safety: Goal: Ability to remain free from injury will improve Outcome: Progressing   

## 2020-07-28 NOTE — Progress Notes (Signed)
Patient O2 SATs at resting is 89-90 on 3 Liters via [place] and walking around O2 SATs are 89-90 on 4 Liters of oxygen [place].

## 2020-07-29 DIAGNOSIS — Z794 Long term (current) use of insulin: Secondary | ICD-10-CM

## 2020-07-29 LAB — CBC WITH DIFFERENTIAL/PLATELET
Abs Immature Granulocytes: 0.23 10*3/uL — ABNORMAL HIGH (ref 0.00–0.07)
Basophils Absolute: 0 10*3/uL (ref 0.0–0.1)
Basophils Relative: 0 %
Eosinophils Absolute: 0.2 10*3/uL (ref 0.0–0.5)
Eosinophils Relative: 2 %
HCT: 43.2 % (ref 39.0–52.0)
Hemoglobin: 14.1 g/dL (ref 13.0–17.0)
Immature Granulocytes: 2 %
Lymphocytes Relative: 12 %
Lymphs Abs: 1.3 10*3/uL (ref 0.7–4.0)
MCH: 29.1 pg (ref 26.0–34.0)
MCHC: 32.6 g/dL (ref 30.0–36.0)
MCV: 89.3 fL (ref 80.0–100.0)
Monocytes Absolute: 0.7 10*3/uL (ref 0.1–1.0)
Monocytes Relative: 7 %
Neutro Abs: 7.8 10*3/uL — ABNORMAL HIGH (ref 1.7–7.7)
Neutrophils Relative %: 77 %
Platelets: 290 10*3/uL (ref 150–400)
RBC: 4.84 MIL/uL (ref 4.22–5.81)
RDW: 13.2 % (ref 11.5–15.5)
WBC: 10.2 10*3/uL (ref 4.0–10.5)
nRBC: 0 % (ref 0.0–0.2)

## 2020-07-29 LAB — COMPREHENSIVE METABOLIC PANEL
ALT: 40 U/L (ref 0–44)
AST: 21 U/L (ref 15–41)
Albumin: 2.8 g/dL — ABNORMAL LOW (ref 3.5–5.0)
Alkaline Phosphatase: 62 U/L (ref 38–126)
Anion gap: 11 (ref 5–15)
BUN: 29 mg/dL — ABNORMAL HIGH (ref 6–20)
CO2: 27 mmol/L (ref 22–32)
Calcium: 8.9 mg/dL (ref 8.9–10.3)
Chloride: 102 mmol/L (ref 98–111)
Creatinine, Ser: 0.96 mg/dL (ref 0.61–1.24)
GFR calc Af Amer: >60 mL/min (ref >60)
GFR calc non Af Amer: >60 mL/min (ref >60)
Glucose, Bld: 134 mg/dL — ABNORMAL HIGH (ref 70–99)
Potassium: 3.8 mmol/L (ref 3.5–5.1)
Sodium: 140 mmol/L (ref 135–145)
Total Bilirubin: 0.8 mg/dL (ref 0.3–1.2)
Total Protein: 6.1 g/dL — ABNORMAL LOW (ref 6.5–8.1)

## 2020-07-29 LAB — GLUCOSE, CAPILLARY: Glucose-Capillary: 95 mg/dL (ref 70–99)

## 2020-07-29 LAB — LACTATE DEHYDROGENASE: LDH: 274 U/L — ABNORMAL HIGH (ref 98–192)

## 2020-07-29 LAB — C-REACTIVE PROTEIN: CRP: 2.1 mg/dL — ABNORMAL HIGH (ref <1.0)

## 2020-07-29 LAB — FERRITIN: Ferritin: 549 ng/mL — ABNORMAL HIGH (ref 24–336)

## 2020-07-29 LAB — PHOSPHORUS: Phosphorus: 4.9 mg/dL — ABNORMAL HIGH (ref 2.5–4.6)

## 2020-07-29 LAB — MAGNESIUM: Magnesium: 2.2 mg/dL (ref 1.7–2.4)

## 2020-07-29 LAB — D-DIMER, QUANTITATIVE: D-Dimer, Quant: 1.55 ug/mL-FEU — ABNORMAL HIGH (ref 0.00–0.50)

## 2020-07-29 MED ORDER — HYDROCOD POLST-CPM POLST ER 10-8 MG/5ML PO SUER
5.0000 mL | Freq: Two times a day (BID) | ORAL | 0 refills | Status: DC
Start: 2020-07-29 — End: 2020-09-19

## 2020-07-29 MED ORDER — PREDNISONE 20 MG PO TABS: 40.0000 mg | ORAL_TABLET | Freq: Every day | ORAL | 0 refills | Status: DC

## 2020-07-29 MED ORDER — BLOOD GLUCOSE MONITOR KIT: PACK | 0 refills | Status: AC

## 2020-07-29 MED ORDER — INSULIN PEN NEEDLE 31G X 5 MM MISC: 0 refills | Status: DC

## 2020-07-29 MED ORDER — INSULIN DETEMIR 100 UNIT/ML FLEXPEN: 10.0000 [IU] | PEN_INJECTOR | Freq: Every day | SUBCUTANEOUS | 0 refills | Status: DC

## 2020-07-29 MED ORDER — AMLODIPINE BESYLATE 5 MG PO TABS
5.0000 mg | ORAL_TABLET | Freq: Every day | ORAL | Status: DC
  Administered 2020-07-29: 5 mg via ORAL
  Filled 2020-07-29: qty 1

## 2020-07-29 MED ORDER — IPRATROPIUM-ALBUTEROL 20-100 MCG/ACT IN AERS: 1.0000 | INHALATION_SPRAY | Freq: Four times a day (QID) | RESPIRATORY_TRACT | 0 refills | Status: AC | PRN

## 2020-07-29 MED ORDER — ASCORBIC ACID 500 MG PO TABS
500.0000 mg | ORAL_TABLET | Freq: Every day | ORAL | 0 refills | Status: DC
Start: 2020-07-29 — End: 2020-09-19

## 2020-07-29 MED ORDER — INSULIN ASPART 100 UNIT/ML FLEXPEN: 10.0000 [IU] | PEN_INJECTOR | Freq: Three times a day (TID) | SUBCUTANEOUS | 0 refills | Status: DC

## 2020-07-29 MED ORDER — HYDROCORTISONE ACETATE 25 MG RE SUPP: 25.0000 mg | Freq: Two times a day (BID) | RECTAL | 0 refills | Status: AC

## 2020-07-29 MED ORDER — ZINC SULFATE 220 (50 ZN) MG PO CAPS: 220.0000 mg | ORAL_CAPSULE | Freq: Every day | ORAL | 0 refills | Status: DC

## 2020-07-29 NOTE — Discharge Summary (Signed)
Physician Discharge Summary  Ricardo Davis ZOX:[id number] DOB: 1960-11-28 DOA: 07/23/2020  PCP: Christain Sacramento, MD  Admit date: 07/23/2020 Discharge date: 07/29/2020  Time spent:35 minutes  Recommendations for Outpatient Follow-up:  Covid vaccination; not vaccinated  Acute respiratory failure with hypoxia/Covid pneumonia COVID-19 Labs  Recent Labs    07/27/20 0401 07/27/20 0855 07/28/20 0805 07/29/20 0506  DDIMER 1.43*  --  1.52* 1.55*  FERRITIN 625*  --  566* 549*  LDH  --  293* 270* 274*  CRP 2.1*  --  2.4* 2.1*    Lab Results  Component Value Date   SARSCOV2NAA POSITIVE (A) 07/23/2020   -Baricitinib per pharmacy protocol x10 days---> 10/1 -Solu-Medrol 40 mg daily -Remdesivir x5 days per pharmacy protocol -Vitamin C and zinc per Covid protocol -Respimat QID SATURATION QUALIFICATIONS: (Thisnote is usedto comply with regulatory documentation for home oxygen) Patient Saturations on Room Air at Rest =89-90% Patient Saturations on [employer] while Ambulating =80% Patient Saturations on4Liters of oxygen while Ambulating = 89-90% Please briefly explain why patient needs home oxygen:unable to maintain O2 on RA, ambulation on 2L drop to 80%, Covid recovery -Patient meets criteria for home O2 -4 L O2 titrate to maintain SPO2> 88% -Provide Inogen home O2 portable concentrator -Patient will be contagious until 08/13/2020 and needs to take below appropriate actions to protect himself and family.   -Follow-up with Dr. Freda Jackson PCCM on 20 October @1030 . 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  Insomnia -Trazodone 50 mg PRN QHS  Hypokalemia -Potassium goal> 4  Essential HTN -Amlodipine 5 mg daily  DM type II controlled without complication/steroid-induced hyperglycemia -9/26 Hemoglobin A1c= 6.5 -9/29 Levemir 10 units daily -9/30 NovoLog 10 units qac    Discharge Diagnoses:  Principal Problem:    Pneumonia due to COVID-19 virus Active Problems:   Hypokalemia   Essential hypertension   Acute respiratory failure with hypoxia (HCC)   Steroid-induced hyperglycemia   Type 2 diabetes mellitus ([place])   Diabetes mellitus type 2, controlled, without complications ([place])   Discharge Condition: Stable  Diet recommendation: Carb modified  Filed Weights   07/23/20 1511 07/24/20 0112  Weight: 113.4 kg 109.1 kg    History of present illness:  Patient admitted to the hospital with a working diagnosis of acute hypoxic respiratory failure due to SARS COVID-19 viral pneumonia.  59 year old WM PMHx HTN, he is a truck driver who   Diagnosed with [place], 2021while in [place]. His symptoms were consistent with cough, fever, nausea and progressive dyspnea. His symptoms were persistent despite outpatient therapy with azithromycin and dexamethasone. He is not vaccinated for COVID-19. On his initial physical examination blood pressure 122/72, pulse rate 88, respiratory 28, oxygen saturation 92% on supplemental oxygen, temperature 99.1, his lungs had coarse breath sounds bilaterally, heart S1-S2, present rhythmic, soft abdomen, no lower extremity edema. Sodium 142, potassium 3.1, chloride 106, bicarb 25, glucose 134, BUN 22, creatinine 1.0, white count 8.1, hemoglobin 15.1, hematocrit 45.7, platelets 292. SARS COVID-19 positive. Chest radiograph with bilateral interstitial infiltrates, left lower lobe, left upper lobe and right upper lobe. EKG 83 bpm, left axis deviation, normal intervals, sinus rhythm, no ST segment or T wave changes, low voltage  Patient has been placed on IV steroids, remdesivir and baricitinib with good toleration.   If continue to improve possible discharge in 24 to [facility] Hospital Course:  See above   Cultures  9/25 SARS coronavirus positive  Antibiotics Anti-infectives (From admission, onward)   Start     Ordered Stop   07/24/20 1000   remdesivir 100 mg in sodium chloride 0.9 % 100 mL IVPB        07/23/20 1735 07/27/20 1329   07/23/20 1800  remdesivir 200 mg in sodium chloride 0.9% 250 mL IVPB        07/23/20 1734 07/23/20 2000       Discharge Exam: Vitals:   07/28/20 1259 07/28/20 1434 07/28/20 2150 07/29/20 0505  BP:  (!) 143/80 (!) 167/93 (!) 165/87  Pulse:  82 85 81  Resp:  13 16 17   Temp:  97.9 F (36.6 C) 97.8 F (36.6 C) (!) 97.5 F (36.4 C)  TempSrc:  Oral Oral Oral  SpO2: 90% 92% 93% 98%  Weight:      Height:        General: A/O x4, positive acute respiratory distress Eyes: negative scleral hemorrhage, negative anisocoria, negative icterus ENT: Negative Runny nose, negative gingival bleeding, Neck:  Negative scars, masses, torticollis, lymphadenopathy, JVD Lungs: decreased breath sounds bilaterally without wheezes or crackles Cardiovascular: Regular rate and rhythm without murmur gallop or rub normal S1 and S2   Discharge Instructions   Allergies as of 07/29/2020   No Known Allergies     Medication List    TAKE these medications   ACETAMINOPHEN PM PO Take 1 tablet by mouth at bedtime as needed (pain, sleep).   amLODipine 5 MG tablet Commonly known as: NORVASC Take 5 mg by mouth at bedtime.   ascorbic acid 500 MG tablet Commonly known as: VITAMIN C Take 1 tablet (500 mg total) by mouth daily.   blood glucose meter kit and supplies Kit Dispense based on patient and insurance preference. Use up to four times daily as directed. (FOR ICD-9 250.00, 250.01).   chlorpheniramine-HYDROcodone 10-8 MG/5ML Suer Commonly known as: TUSSIONEX Take 5 mLs by mouth every 12 (twelve) hours.   DELSYM COUGH/COLD NIGHT TIME PO Take 1 Dose by mouth at bedtime as needed (cough, sleep).   hydrocortisone 25 MG suppository Commonly known as: ANUSOL-HC Place 1 suppository (25 mg total) rectally 2 (two) times daily.   insulin aspart 100 UNIT/ML FlexPen Commonly known as: NOVOLOG Inject 10 Units into  the skin 3 (three) times daily with meals.   insulin detemir 100 UNIT/ML FlexPen Commonly known as: LEVEMIR Inject 10 Units into the skin daily.   Insulin Pen Needle 31G X 5 MM Misc Use as directed   Ipratropium-Albuterol 20-100 MCG/ACT Aers respimat Commonly known as: COMBIVENT Inhale 1 puff into the lungs every 6 (six) hours as needed for wheezing or shortness of breath.   predniSONE 20 MG tablet Commonly known as: Deltasone Take 2 tablets (40 mg total) by mouth daily with breakfast.   zinc sulfate 220 (50 Zn) MG capsule Take 1 capsule (220 mg total) by mouth daily.   zolpidem 10 MG tablet Commonly known as: AMBIEN Take 10 mg by mouth at bedtime.            Durable Medical Equipment  (From admission, onward)         Start     Ordered   07/28/20 1646  For home use only DME oxygen  Once       Comments: SATURATION QUALIFICATIONS: (This note is used to comply with regulatory documentation for home oxygen) Patient Saturations on Room Air at Rest = 89-90% Patient Saturations on Room Air while Ambulating = 80%  Patient Saturations on 4 Liters of oxygen while Ambulating = 89-90% Please briefly explain why patient needs home oxygen: unable to maintain O2 on RA, ambulation on 2L drop to 80%, Covid recovery -Patient meets criteria for home O2 -4 L O2 titrate to maintain SPO2> 88% -Provide Inogen home O2 portable concentrator  Question Answer Comment  Length of Need Lifetime   Mode or (Route) Nasal cannula   Liters per Minute 4   Frequency Continuous (stationary and portable oxygen unit needed)   Oxygen conserving device Yes   Oxygen delivery system Gas      07/28/20 1646         No Known Allergies  Follow-up Information    Freddi Starr, MD Follow up on 08/17/2020.   Specialty: Pulmonary Disease Why: Follow-up with Dr. Freda Jackson PCCM on 20 October @1030 . 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    Significant Diagnostic Studies: DG Chest Port 1 View  Result Date: 07/23/2020 CLINICAL DATA:  Shortness of breath. COVID-19 positive. EXAM: PORTABLE CHEST 1 VIEW COMPARISON:  None. FINDINGS: Cardiomediastinal silhouette is normal. Mediastinal contours appear intact. Hazy airspace opacities in bilateral lungs with lower lobe predominance. Osseous structures are without acute abnormality. Soft tissues are grossly normal. IMPRESSION: Hazy airspace opacities in bilateral lungs with lower lobe predominance, consistent with multifocal atypical pneumonia. Electronically Signed   By: Fidela Salisbury M.D.   On: 07/23/2020 16:05    Microbiology: Recent Results (from the past 240 hour(s))  SARS Coronavirus 2 by RT PCR (hospital order, performed in [facility] hospital lab) Nasopharyngeal Nasopharyngeal Swab     Status: Abnormal   Collection Time: 07/23/20  3:31 PM   Specimen: Nasopharyngeal Swab  Result Value Ref Range Status   SARS Coronavirus 2 POSITIVE (A) NEGATIVE Final    Comment: RESULT CALLED TO, READ BACK BY AND VERIFIED WITH: HALL,C. RN AT 0865 07/23/20 MULLINS,T (NOTE) SARS-CoV-2 target nucleic acids are DETECTED  SARS-CoV-2 RNA is generally detectable in upper respiratory specimens  during the acute phase of infection.  Positive results are indicative  of the presence of the identified virus, but do not rule out bacterial infection or co-infection with other pathogens not detected by the test.  Clinical correlation with patient history and  other diagnostic information is necessary to determine patient infection status.  The expected result is negative.  Fact Sheet for Patients:    StrictlyIdeas.no   Fact Sheet for Healthcare Providers:   BankingDealers.co.za    This test is not yet approved or cleared by the [place] FDA and  has been authorized for detection and/or diagnosis of SARS-CoV-2 by FDA under an Emergency Use Authorization (EUA).  This EUA will remain in effect (meaning this  test can be used) for the duration of  the COVID-19 declaration under Section 564(b)(1) of the Act, 21 U.S.C. section 360-bbb-3(b)(1), unless the authorization is terminated or revoked sooner.  Performed at [facility], [place] [address], [place], [place] [address]   Blood Culture (routine x 2)     Status: None   Collection Time: 07/23/20  4:14 PM   Specimen: BLOOD  Result Value Ref Range Status   Specimen Description   Final    BLOOD RIGHT ANTECUBITAL Performed at [facility] [address] [place], [place] [address]    Special Requests   Final    BOTTLES DRAWN AEROBIC AND ANAEROBIC Blood Culture adequate volume Performed at [facility] [address], [place], [place] [address]    Culture   Final    NO GROWTH 5 DAYS Performed at [facility], [place] [address], [place], [place] [address]    Report Status 07/28/2020 FINAL  Final     Labs: Basic Metabolic Panel: Recent Labs  Lab 07/23/20 1531 07/23/20 1721 07/24/20 0452 07/24/20 0452 07/25/20 0546 07/26/20 0522 07/27/20 0401 07/27/20 0855 07/28/20 0805 07/29/20 0506  NA   < >  --  141   < > 140 137 138  --  142 140  K   < >  --  4.1   < > 3.7 3.8 3.9  --  4.1 3.8  CL   < >  --  106   < > 107 103 102  --  102 102  CO2   < >  --  23   < > 23 22 23   --  27 27  GLUCOSE   < >  --  284*   < > 271* 275* 248*  --  163* 134*  BUN   < >  --  26*   < > 32* 35* 34*  --  34* 29*  CREATININE   < > 1.11 1.03   < > 0.93 0.93 0.97  --  1.16 0.96  CALCIUM   < >  --  8.9   < > 9.0 8.9 8.7*  --  9.3 8.9  MG  --   1.8 2.0  --   --   --   --  2.3 2.5* 2.2  PHOS  --   --  3.3  --   --   --   --  4.7* 5.3* 4.9*   < > = values in this interval not displayed.   Liver Function Tests: Recent Labs  Lab 07/25/20 0546 07/26/20 0522 07/27/20 0401 07/28/20 0805 07/29/20 0506  AST 23 22 34 24 21  ALT 34 37 51* 44 40  ALKPHOS 58 54 55 69 62  BILITOT 0.4 0.3 0.3 0.7 0.8  PROT 6.5 6.1* 6.1* 6.6 6.1*  ALBUMIN 2.9* 2.7* 2.8* 3.1* 2.8*   No results for input(s): LIPASE, AMYLASE in the last 168 hours. No results for input(s): AMMONIA in the last 168 hours. CBC: Recent Labs  Lab 07/23/20 1531 07/23/20 1531 07/23/20 1721 07/24/20 0452 07/27/20 0855 07/28/20 0805 07/29/20 0506  WBC 8.1   < > 7.7 4.3 9.7 9.6 10.2  NEUTROABS 6.2  --   --  3.5 8.5* 7.3 7.8*  HGB 15.1   < > 15.8 13.5 14.2 13.8 14.1  HCT 45.7   < > 47.1 40.7 43.5 42.9 43.2  MCV 88.1   < > 88.5 88.3 90.2 90.5 89.3  PLT 292   < > 303 268 358 346 290   < > = values in this interval not displayed.   Cardiac Enzymes: No results for input(s): CKTOTAL, CKMB, CKMBINDEX, TROPONINI in the last 168 hours. BNP: BNP (last 3 results) No results for input(s): BNP in the last 8760 hours.  ProBNP (last 3 results) No results for input(s): PROBNP in the last 8760 hours.  CBG:  Recent Labs  Lab 07/28/20 0809 07/28/20 1150 07/28/20 1653 07/28/20 2149 07/29/20 0757  GLUCAP 152* 272* 310* 190* 95       Signed:  Dia Crawford, MD [facility] [phone] pager

## 2020-07-29 NOTE — TOC Progression Note (Signed)
Transition of Care [facility]) - Progression Note    Patient Details  Name: Ricardo Davis MRN: [id number] Date of Birth: [birth year]  [facility] CM/SW Contact  Geni Bers, RN Phone Number: 07/29/2020, 10:06 AM  Clinical Narrative:     Spoke with pt who selected [facility] for Home O2.   Expected Discharge Plan: Home/Self Care Barriers to Discharge: No Barriers Identified  Expected Discharge Plan and Services Expected Discharge Plan: Home/Self Care       Living arrangements for the past 2 months: Single Family Home Expected Discharge Date: 07/29/20                                     Social Determinants of Health (SDOH) Interventions    Readmission Risk Interventions No flowsheet data found.

## 2020-07-29 NOTE — Plan of Care (Signed)
No signs or symptoms of sepsis.  Problem: Clinical Measurements: Goal: Ability to maintain clinical measurements within normal limits will improve Outcome: Completed/Met Goal: Respiratory complications will improve Outcome: Completed/Met Goal: Cardiovascular complication will be avoided Outcome: Completed/Met   Problem: Activity: Goal: Risk for activity intolerance will decrease Outcome: Completed/Met   Problem: Nutrition: Goal: Adequate nutrition will be maintained Outcome: Completed/Met   Problem: Pain Managment: Goal: General experience of comfort will improve Outcome: Completed/Met   Problem: Safety: Goal: Ability to remain free from injury will improve Outcome: Completed/Met   Problem: Skin Integrity: Goal: Risk for impaired skin integrity will decrease Outcome: Completed/Met

## 2020-08-17 ENCOUNTER — Encounter: Payer: Self-pay | Admitting: Pulmonary Disease

## 2020-08-17 ENCOUNTER — Telehealth: Payer: Self-pay | Admitting: Internal Medicine

## 2020-08-17 ENCOUNTER — Ambulatory Visit (HOSPITAL_BASED_OUTPATIENT_CLINIC_OR_DEPARTMENT_OTHER)
Admission: RE | Admit: 2020-08-17 | Discharge: 2020-08-17 | Disposition: A | Discharge status: Home / Self Care | Payer: Commercial Managed Care - PPO | Source: Ambulatory Visit | Attending: Pulmonary Disease | Admitting: Pulmonary Disease

## 2020-08-17 ENCOUNTER — Encounter (HOSPITAL_BASED_OUTPATIENT_CLINIC_OR_DEPARTMENT_OTHER): Payer: Self-pay

## 2020-08-17 ENCOUNTER — Ambulatory Visit: Payer: Commercial Managed Care - PPO | Admitting: Pulmonary Disease

## 2020-08-17 ENCOUNTER — Other Ambulatory Visit: Payer: Self-pay

## 2020-08-17 VITALS — BP 104/66 | HR 97 | Temp 97.3°F | Ht 75.0 in | Wt 232.2 lb

## 2020-08-17 DIAGNOSIS — R079 Chest pain, unspecified: Secondary | ICD-10-CM | POA: Insufficient documentation

## 2020-08-17 DIAGNOSIS — I2693 Single subsegmental pulmonary embolism without acute cor pulmonale: Secondary | ICD-10-CM

## 2020-08-17 DIAGNOSIS — U071 COVID-19: Secondary | ICD-10-CM

## 2020-08-17 DIAGNOSIS — J9601 Acute respiratory failure with hypoxia: Secondary | ICD-10-CM

## 2020-08-17 DIAGNOSIS — J1282 Pneumonia due to coronavirus disease 2019: Secondary | ICD-10-CM

## 2020-08-17 MED ORDER — APIXABAN 5 MG PO TABS: ORAL_TABLET | ORAL | 5 refills | Status: AC

## 2020-08-17 MED ORDER — IOHEXOL 350 MG/ML SOLN
100.0000 mL | Freq: Once | INTRAVENOUS | Status: AC | PRN
  Administered 2020-08-17: 100 mL via INTRAVENOUS

## 2020-08-17 MED ORDER — FLOVENT HFA 220 MCG/ACT IN AERO: 2.0000 | INHALATION_SPRAY | Freq: Two times a day (BID) | RESPIRATORY_TRACT | 12 refills | Status: AC

## 2020-08-17 NOTE — Addendum Note (Signed)
Addended by: Melody Comas on: 08/17/2020 02:50 PM   Modules accepted: Orders

## 2020-08-17 NOTE — Progress Notes (Addendum)
Addendum:  Notified of patients CT chest scan that is notable for right lower lobe segmental pulmonary emboli.  We will start him on eliquis therapy.       Synopsis: Referred by Dia Crawford, MD for Acute Respiratory Failure from Covid Pneumonia  Subjective:   [name] GENDER: male DOB: 06/19/1961, MRN: [id number]   HPI  Chief Complaint  Patient presents with  . Consult    Post-covid pneumonia, still having sob, prod cough-clear,thick mucus. Denies wheezing.    Ricardo Davis is a 59 year old male with hypertension and diabetes mellitus type II who is referred to pulmonary clinic following hospitalization with covid 19 pneumonia and acute hypoxemic respiratory failure.   He was diagnosed with covid on 07/18/20 while in [place] and was treated with outpatient azithromycin and dexamethasone. His symptoms progressed and he was admitted 07/23/20 to 07/29/20 for respiratory failure in setting of covid 19. He was unvaccinated. He received baricitinib for 10 days, remdesivir for 5 days and solumedrol 14m. He was discharged on 4L of oxygen with ambulation while saturating 89-90% and was saturating around 89-90% at rest on room air.   He reports continued dyspnea with exertion. He remains on 4-5L of oxygen. He is using combivent 3 times daily. He does have occasional productive cough. He denies fevers. He denies chest pain or discomfort.   He is a former smoker, quit in 2006. He smoked for 15-20 years, 1.5 packs per day so he has a 22.5 to 30 year smoking history. 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   He has intentionally lost 100lbs over the past 1 year with altering his diet.   His mother was a smoker and had throat cancer. He denies history of blood clots.   Past Medical History:  Diagnosis Date  . Hypertension      History reviewed. No pertinent family history.   Social History   Socioeconomic History  . Marital status:  Married    Spouse name: Not on file  . Number of children: Not on file  . Years of education: Not on file  . Highest education level: Not on file  Occupational History  . Not on file  Tobacco Use  . Smoking status: Never Smoker  . Smokeless tobacco: Never Used  Vaping Use  . Vaping Use: Never used  Substance and Sexual Activity  . Alcohol use: Not Currently  . Drug use: Never  . Sexual activity: Yes    Birth control/protection: None  Other Topics Concern  . Not on file  Social History Narrative  . Not on file   Social Determinants of Health   Financial Resource Strain:   . Difficulty of Paying Living Expenses: Not on file  Food Insecurity:   . Worried About RCharity fundraiserin the Last Year: Not on file  . Ran Out of Food in the Last Year: Not on file  Transportation Needs:   . Lack of Transportation (Medical): Not on file  . Lack of Transportation (Non-Medical): Not on file  Physical Activity:   . Days of Exercise per Week: Not on file  . Minutes of Exercise per Session: Not on file  Stress:   . Feeling of Stress : Not on file  Social Connections:   . Frequency of Communication with Friends and Family: Not on file  . Frequency of Social Gatherings with Friends and Family: Not on file  . Attends Religious  Services: Not on file  . Active Member of Clubs or Organizations: Not on file  . Attends Archivist Meetings: Not on file  . Marital Status: Not on file  Intimate Partner Violence:   . Fear of Current or Ex-Partner: Not on file  . Emotionally Abused: Not on file  . Physically Abused: Not on file  . Sexually Abused: Not on file     No Known Allergies   Outpatient Medications Prior to Visit  Medication Sig Dispense Refill  . amLODipine (NORVASC) 5 MG tablet Take 5 mg by mouth at bedtime.     Marland Kitchen ascorbic acid (VITAMIN C) 500 MG tablet Take 1 tablet (500 mg total) by mouth daily. 30 tablet 0  . blood glucose meter kit and supplies KIT Dispense based  on patient and insurance preference. Use up to four times daily as directed. (FOR ICD-9 250.00, 250.01). 1 each 0  . diphenhydrAMINE-APAP, sleep, (ACETAMINOPHEN PM PO) Take 1 tablet by mouth at bedtime as needed (pain, sleep).    . diphenhydrAMINE-PE-APAP (DELSYM COUGH/COLD NIGHT TIME PO) Take 1 Dose by mouth at bedtime as needed (cough, sleep).    . hydrocortisone (ANUSOL-HC) 25 MG suppository Place 1 suppository (25 mg total) rectally 2 (two) times daily. 12 suppository 0  . Ipratropium-Albuterol (COMBIVENT) 20-100 MCG/ACT AERS respimat Inhale 1 puff into the lungs every 6 (six) hours as needed for wheezing or shortness of breath. 4 g 0  . zinc sulfate 220 (50 Zn) MG capsule Take 1 capsule (220 mg total) by mouth daily. 30 capsule 0  . zolpidem (AMBIEN) 10 MG tablet Take 10 mg by mouth at bedtime.     . chlorpheniramine-HYDROcodone (TUSSIONEX) 10-8 MG/5ML SUER Take 5 mLs by mouth every 12 (twelve) hours. (Patient not taking: Reported on 08/17/2020) 70 mL 0  . insulin aspart (NOVOLOG) 100 UNIT/ML FlexPen Inject 10 Units into the skin 3 (three) times daily with meals. (Patient not taking: Reported on 08/17/2020) 15 mL 0  . insulin detemir (LEVEMIR) 100 UNIT/ML FlexPen Inject 10 Units into the skin daily. (Patient not taking: Reported on 08/17/2020) 15 mL 0  . Insulin Pen Needle 31G X 5 MM MISC Use as directed (Patient not taking: Reported on 08/17/2020) 100 each 0  . predniSONE (DELTASONE) 20 MG tablet Take 2 tablets (40 mg total) by mouth daily with breakfast. (Patient not taking: Reported on 08/17/2020) 10 tablet 0   No facility-administered medications prior to visit.    Review of Systems  Constitutional: Positive for malaise/fatigue and weight loss. Negative for chills and fever.  HENT: Positive for congestion. Negative for sore throat.   Eyes: Negative for blurred vision.  Respiratory: Positive for cough, sputum production and shortness of breath. Negative for hemoptysis and wheezing.     Cardiovascular: Negative for chest pain, palpitations, orthopnea, claudication, leg swelling and PND.  Gastrointestinal: Negative for abdominal pain, heartburn, nausea and vomiting.  Genitourinary: Negative for hematuria.  Musculoskeletal: Negative for joint pain and myalgias.  Skin: Negative for itching and rash.  Neurological: Negative for dizziness and headaches.  Endo/Heme/Allergies: Does not bruise/bleed easily.  Psychiatric/Behavioral: Negative.    Objective:   Vitals:   08/17/20 1047  BP: 104/66  Pulse: 97  Temp: (!) 97.3 F (36.3 C)  TempSrc: Temporal  SpO2: 97%  Weight: 232 lb 3.2 oz (105.3 kg)  Height: 6' 3"  (1.905 m)     Physical Exam Constitutional:      Appearance: He is ill-appearing.  HENT:  Head: Normocephalic and atraumatic.     Nose: Nose normal.     Mouth/Throat:     Mouth: Mucous membranes are moist.     Pharynx: Oropharynx is clear.  Eyes:     General: No scleral icterus.    Conjunctiva/sclera: Conjunctivae normal.     Pupils: Pupils are equal, round, and reactive to light.  Cardiovascular:     Rate and Rhythm: Tachycardia present.     Pulses: Normal pulses.     Heart sounds: Normal heart sounds. No murmur heard.   Pulmonary:     Breath sounds: Decreased air movement present. No wheezing, rhonchi or rales.  Abdominal:     General: Bowel sounds are normal.     Palpations: Abdomen is soft.  Musculoskeletal:     Right lower leg: No edema.     Left lower leg: No edema.  Neurological:     General: No focal deficit present.     Mental Status: He is alert and oriented to person, place, and time. Mental status is at baseline.  Psychiatric:        Mood and Affect: Mood normal.        Behavior: Behavior normal.        Thought Content: Thought content normal.        Judgment: Judgment normal.     CBC    Component Value Date/Time   WBC 10.2 07/29/2020 0506   RBC 4.84 07/29/2020 0506   HGB 14.1 07/29/2020 0506   HCT 43.2 07/29/2020 0506    PLT 290 07/29/2020 0506   MCV 89.3 07/29/2020 0506   MCH 29.1 07/29/2020 0506   MCHC 32.6 07/29/2020 0506   RDW 13.2 07/29/2020 0506   LYMPHSABS 1.3 07/29/2020 0506   MONOABS 0.7 07/29/2020 0506   EOSABS 0.2 07/29/2020 0506   BASOSABS 0.0 07/29/2020 0506   BMP Latest Ref Rng & Units 07/29/2020 07/28/2020 07/27/2020  Glucose 70 - 99 mg/dL 134(H) 163(H) 248(H)  BUN 6 - 20 mg/dL 29(H) 34(H) 34(H)  Creatinine 0.61 - 1.24 mg/dL 0.96 1.16 0.97  Sodium 135 - 145 mmol/L 140 142 138  Potassium 3.5 - 5.1 mmol/L 3.8 4.1 3.9  Chloride 98 - 111 mmol/L 102 102 102  CO2 22 - 32 mmol/L 27 27 23   Calcium 8.9 - 10.3 mg/dL 8.9 9.3 8.7(L)     Chest imaging: CXR 07/23/20 Cardiomediastinal silhouette is normal. Mediastinal contours appear intact.  Hazy airspace opacities in bilateral lungs with lower lobe predominance.  Osseous structures are without acute abnormality. Soft tissues are grossly normal.  PFT: None on file  Labs: Reviewed as above  Assessment & Plan:   Acute respiratory failure with hypoxia (HCC)  Pneumonia due to COVID-19 virus  Chest pain, unspecified type - Plan: CT Angio Chest W/Cm &/Or Wo Cm  Discussion: Ricardo Davis is a 59 year old male with hypertension and diabetes mellitus type II who is referred to pulmonary clinic following hospitalization with covid 19 pneumonia and acute hypoxemic respiratory failure.  He continues to require supplemental oxygen, 2L at rest and 4-5L with ambulation.   He is to continue using combivent inhaler as needed up to 3 times per day. We will start him on flovent 235mg 2 puffs twice daily and monitor for any improvement in his breathing symptoms.   We will check a CTA Chest due to concern for thromboembolic disease given recent covid 19 infection along with persistent need for supplemental oxygen and tachycardia.   Patient to follow up  in 4 weeks  Freda Jackson, MD [facility] Office:  [phone]    Current Outpatient Medications:  .  amLODipine (NORVASC) 5 MG tablet, Take 5 mg by mouth at bedtime. , Disp: , Rfl:  .  ascorbic acid (VITAMIN C) 500 MG tablet, Take 1 tablet (500 mg total) by mouth daily., Disp: 30 tablet, Rfl: 0 .  blood glucose meter kit and supplies KIT, Dispense based on patient and insurance preference. Use up to four times daily as directed. (FOR ICD-9 250.00, 250.01)., Disp: 1 each, Rfl: 0 .  diphenhydrAMINE-APAP, sleep, (ACETAMINOPHEN PM PO), Take 1 tablet by mouth at bedtime as needed (pain, sleep)., Disp: , Rfl:  .  diphenhydrAMINE-PE-APAP (DELSYM COUGH/COLD NIGHT TIME PO), Take 1 Dose by mouth at bedtime as needed (cough, sleep)., Disp: , Rfl:  .  hydrocortisone (ANUSOL-HC) 25 MG suppository, Place 1 suppository (25 mg total) rectally 2 (two) times daily., Disp: 12 suppository, Rfl: 0 .  Ipratropium-Albuterol (COMBIVENT) 20-100 MCG/ACT AERS respimat, Inhale 1 puff into the lungs every 6 (six) hours as needed for wheezing or shortness of breath., Disp: 4 g, Rfl: 0 .  zinc sulfate 220 (50 Zn) MG capsule, Take 1 capsule (220 mg total) by mouth daily., Disp: 30 capsule, Rfl: 0 .  zolpidem (AMBIEN) 10 MG tablet, Take 10 mg by mouth at bedtime. , Disp: , Rfl:  .  chlorpheniramine-HYDROcodone (TUSSIONEX) 10-8 MG/5ML SUER, Take 5 mLs by mouth every 12 (twelve) hours. (Patient not taking: Reported on 08/17/2020), Disp: 70 mL, Rfl: 0 .  fluticasone (FLOVENT HFA) 220 MCG/ACT inhaler, Inhale 2 puffs into the lungs in the morning and at bedtime., Disp: 1 each, Rfl: 12 .  insulin aspart (NOVOLOG) 100 UNIT/ML FlexPen, Inject 10 Units into the skin 3 (three) times daily with meals. (Patient not taking: Reported on 08/17/2020), Disp: 15 mL, Rfl: 0 .  insulin detemir (LEVEMIR) 100 UNIT/ML FlexPen, Inject 10 Units into the skin daily. (Patient not taking: Reported on 08/17/2020), Disp: 15 mL, Rfl: 0 .  Insulin Pen Needle 31G X 5 MM MISC, Use as directed (Patient not  taking: Reported on 08/17/2020), Disp: 100 each, Rfl: 0 .  predniSONE (DELTASONE) 20 MG tablet, Take 2 tablets (40 mg total) by mouth daily with breakfast. (Patient not taking: Reported on 08/17/2020), Disp: 10 tablet, Rfl: 0

## 2020-08-17 NOTE — Telephone Encounter (Signed)
Received a call from [place] at [facility] regarding CT angio:  IMPRESSION: 1. Widespread patchy pulmonary infiltrates consistent with coronavirus infection. Some of the density could relate to pre-existing chronic interstitial lung disease and upper lobe emphysema, with the findings could all relate to active infection or sequela of previous infection. 2. Single small pulmonary embolus in the right lower lobe. No evidence of extensive embolic disease. 3. Aortic atherosclerosis. Coronary artery calcification.  Aortic Atherosclerosis (ICD10-I70.0) and Emphysema (ICD10-J43.9).  Provided information to Dr. Francine Graven.  He states that the patient is ok to leave.  He will start patient on Eliquis.  Returned call to [place] at [facility] and advised that the patient is ok to go home.

## 2020-08-17 NOTE — Patient Instructions (Addendum)
Continue on 2L of O2 at rest Continue on 4-5L of O2 with walking Continue combivent inhaler as needed Start flovent inhaler, 2 puffs twice daily Use incentive spirometer 3-5 times per day

## 2020-08-18 ENCOUNTER — Telehealth: Payer: Self-pay | Admitting: Pulmonary Disease

## 2020-08-18 NOTE — Telephone Encounter (Signed)
Patient dropped of disability forms when he was at his appt on 08/17/2020. Called and spoke to patient and his wife advised that we have documents that he needs to sign and well as the $29 fee. Advised I will mail the necessary documents for signature, to avoid trip to the office, and as soon as forms are signed I will call to collect the fee. Form given to Dr. Francine Graven for signature. -pr

## 2020-08-19 ENCOUNTER — Telehealth: Payer: Self-pay | Admitting: Pulmonary Disease

## 2020-08-19 NOTE — Telephone Encounter (Signed)
Rec'd signed form back from Dr. Francine Graven. Waiting for Marisue Ivan to drop the charge. -pr

## 2020-08-19 NOTE — Telephone Encounter (Signed)
Copy of pt's last OV has been placed in the mail for pt and wife. Nothing further needed.

## 2020-08-22 DIAGNOSIS — J9601 Acute respiratory failure with hypoxia: Secondary | ICD-10-CM

## 2020-08-22 NOTE — Telephone Encounter (Signed)
Charge dropped, left message for patient to call back to make $29 pymt. -pr

## 2020-08-23 NOTE — Telephone Encounter (Signed)
Rec'd payment - Faxed form -pr

## 2020-09-19 ENCOUNTER — Encounter: Payer: Self-pay | Admitting: Pulmonary Disease

## 2020-09-19 ENCOUNTER — Ambulatory Visit (INDEPENDENT_AMBULATORY_CARE_PROVIDER_SITE_OTHER): Payer: Commercial Managed Care - PPO | Admitting: Pulmonary Disease

## 2020-09-19 ENCOUNTER — Other Ambulatory Visit: Payer: Self-pay

## 2020-09-19 VITALS — BP 120/70 | Temp 98.0°F | Ht 75.0 in | Wt 244.8 lb

## 2020-09-19 DIAGNOSIS — J9611 Chronic respiratory failure with hypoxia: Secondary | ICD-10-CM

## 2020-09-19 DIAGNOSIS — U071 COVID-19: Secondary | ICD-10-CM | POA: Diagnosis not present

## 2020-09-19 DIAGNOSIS — J1282 Pneumonia due to coronavirus disease 2019: Secondary | ICD-10-CM

## 2020-09-19 NOTE — Progress Notes (Signed)
Synopsis: Referred by Dia Crawford, MD for Acute Respiratory Failure from Covid Pneumonia  Subjective:   [name] GENDER: male DOB: [birth year], MRN: [id number]   HPI  Chief Complaint  Patient presents with  . Follow-up    Getting better. Productive cough at night.   Ricardo Davis is a 59 year old male with hypertension who returns to pulmonary clinic for follow up of covid 19 pneumonia, pulmonary emboli and chronic hypoxemic respiratory failure.   He was diagnosed with covid on 07/18/20 while in [place] and was treated with outpatient azithromycin and dexamethasone. His symptoms progressed and he was admitted 07/23/20 to 07/29/20 for respiratory failure in setting of covid 19. He was unvaccinated. He received baricitinib for 10 days, remdesivir for 5 days and solumedrol 74m. He was discharged on 4L of oxygen with ambulation while saturating 89-90% and was saturating around 89-90% at rest on room air.   He is currently on 1.5L at rest. He reports walking around his home and property without oxygen. Saturations can drop into the 70s per his wife. He overall reports feeling a lot better but continues to have exertional dyspnea. He is not planned to return to work until the new year.   He is taking flovent 2230m 2 puffs twice daily and has as needed combivent.   He started eliquis at last visit for right lower lobe segmental pulmonary emboli noted on CTA chest scan. He stopped this mediation about a week ago as he did not know he was supposed to continue this medicaiton.  He is a former smoker, quit in 2006. He smoked for 15-20 years, 1.5 packs per day so he has a 22.5 to 30 year smoking history. 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   He has intentionally lost 100lbs over the past 1 year with altering his diet.   His mother was a smoker and had throat cancer. He denies history of blood clots.   Past Medical History:  Diagnosis  Date  . Hypertension      History reviewed. No pertinent family history.   Social History   Socioeconomic History  . Marital status: Married    Spouse name: Not on file  . Number of children: Not on file  . Years of education: Not on file  . Highest education level: Not on file  Occupational History  . Not on file  Tobacco Use  . Smoking status: Former Smoker    Types: Cigarettes    Start date: 10/29/1981    Quit date: 11/20/1999    Years since quitting: 20.8  . Smokeless tobacco: Current User    Types: Snuff  Vaping Use  . Vaping Use: Never used  Substance and Sexual Activity  . Alcohol use: Not Currently  . Drug use: Never  . Sexual activity: Yes    Birth control/protection: None  Other Topics Concern  . Not on file  Social History Narrative  . Not on file   Social Determinants of Health   Financial Resource Strain:   . Difficulty of Paying Living Expenses: Not on file  Food Insecurity:   . Worried About Ru[employer] fundraisern the Last Year: Not on file  . Ran Out of Food in the Last Year: Not on file  Transportation Needs:   . Lack of Transportation (Medical): Not on file  . Lack of Transportation (Non-Medical): Not on file  Physical Activity:   . Days  of Exercise per Week: Not on file  . Minutes of Exercise per Session: Not on file  Stress:   . Feeling of Stress : Not on file  Social Connections:   . Frequency of Communication with Friends and Family: Not on file  . Frequency of Social Gatherings with Friends and Family: Not on file  . Attends Religious Services: Not on file  . Active Member of Clubs or Organizations: Not on file  . Attends Archivist Meetings: Not on file  . Marital Status: Not on file  Intimate Partner Violence:   . Fear of Current or Ex-Partner: Not on file  . Emotionally Abused: Not on file  . Physically Abused: Not on file  . Sexually Abused: Not on file     No Known Allergies   Outpatient Medications Prior to Visit   Medication Sig Dispense Refill  . amLODipine (NORVASC) 5 MG tablet Take 5 mg by mouth at bedtime.     Marland Kitchen apixaban (ELIQUIS) 5 MG TABS tablet Take 2 tablets (7m) twice daily for 7 days, then 1 tablet (548m twice daily 60 tablet 5  . blood glucose meter kit and supplies KIT Dispense based on patient and insurance preference. Use up to four times daily as directed. (FOR ICD-9 250.00, 250.01). 1 each 0  . diphenhydrAMINE-APAP, sleep, (ACETAMINOPHEN PM PO) Take 1 tablet by mouth at bedtime as needed (pain, sleep).    . diphenhydrAMINE-PE-APAP (DELSYM COUGH/COLD NIGHT TIME PO) Take 1 Dose by mouth at bedtime as needed (cough, sleep).    . fluticasone (FLOVENT HFA) 220 MCG/ACT inhaler Inhale 2 puffs into the lungs in the morning and at bedtime. 1 each 12  . hydrocortisone (ANUSOL-HC) 25 MG suppository Place 1 suppository (25 mg total) rectally 2 (two) times daily. 12 suppository 0  . Ipratropium-Albuterol (COMBIVENT) 20-100 MCG/ACT AERS respimat Inhale 1 puff into the lungs every 6 (six) hours as needed for wheezing or shortness of breath. 4 g 0  . zolpidem (AMBIEN) 10 MG tablet Take 10 mg by mouth at bedtime.     . Marland Kitchenscorbic acid (VITAMIN C) 500 MG tablet Take 1 tablet (500 mg total) by mouth daily. 30 tablet 0  . chlorpheniramine-HYDROcodone (TUSSIONEX) 10-8 MG/5ML SUER Take 5 mLs by mouth every 12 (twelve) hours. (Patient not taking: Reported on 08/17/2020) 70 mL 0  . insulin aspart (NOVOLOG) 100 UNIT/ML FlexPen Inject 10 Units into the skin 3 (three) times daily with meals. (Patient not taking: Reported on 08/17/2020) 15 mL 0  . insulin detemir (LEVEMIR) 100 UNIT/ML FlexPen Inject 10 Units into the skin daily. (Patient not taking: Reported on 08/17/2020) 15 mL 0  . Insulin Pen Needle 31G X 5 MM MISC Use as directed (Patient not taking: Reported on 08/17/2020) 100 each 0  . predniSONE (DELTASONE) 20 MG tablet Take 2 tablets (40 mg total) by mouth daily with breakfast. (Patient not taking: Reported on  08/17/2020) 10 tablet 0  . zinc sulfate 220 (50 Zn) MG capsule Take 1 capsule (220 mg total) by mouth daily. (Patient not taking: Reported on 09/19/2020) 30 capsule 0   No facility-administered medications prior to visit.    Review of Systems  Constitutional: Negative for chills, fever, malaise/fatigue and weight loss.  HENT: Negative for congestion and sore throat.   Eyes: Negative for blurred vision.  Respiratory: Positive for cough, sputum production and shortness of breath. Negative for hemoptysis and wheezing.   Cardiovascular: Negative for chest pain, palpitations, orthopnea, claudication, leg swelling and PND.  Gastrointestinal: Negative for abdominal pain, heartburn, nausea and vomiting.  Genitourinary: Negative for hematuria.  Musculoskeletal: Negative for joint pain and myalgias.  Skin: Negative for itching and rash.  Neurological: Negative for dizziness and headaches.  Endo/Heme/Allergies: Does not bruise/bleed easily.  Psychiatric/Behavioral: Negative.    Objective:   Vitals:   09/19/20 1420  BP: 120/70  Temp: 98 F (36.7 C)  TempSrc: Oral  SpO2: 100%  Weight: 244 lb 12.8 oz (111 kg)  Height: 6' 3"  (1.905 m)     Physical Exam Constitutional:      General: He is not in acute distress.    Appearance: Normal appearance. He is not ill-appearing.  HENT:     Head: Normocephalic and atraumatic.     Nose: Nose normal.  Eyes:     General: No scleral icterus.    Conjunctiva/sclera: Conjunctivae normal.     Pupils: Pupils are equal, round, and reactive to light.  Cardiovascular:     Rate and Rhythm: Normal rate and regular rhythm.     Pulses: Normal pulses.     Heart sounds: Normal heart sounds. No murmur heard.   Pulmonary:     Effort: Pulmonary effort is normal.     Breath sounds: No decreased air movement. Rales (faint, left base) present. No wheezing or rhonchi.  Abdominal:     General: Bowel sounds are normal.     Palpations: Abdomen is soft.   Musculoskeletal:     Right lower leg: No edema.     Left lower leg: No edema.  Neurological:     General: No focal deficit present.     Mental Status: He is alert and oriented to person, place, and time. Mental status is at baseline.  Psychiatric:        Mood and Affect: Mood normal.        Behavior: Behavior normal.        Thought Content: Thought content normal.        Judgment: Judgment normal.     CBC    Component Value Date/Time   WBC 10.2 07/29/2020 0506   RBC 4.84 07/29/2020 0506   HGB 14.1 07/29/2020 0506   HCT 43.2 07/29/2020 0506   PLT 290 07/29/2020 0506   MCV 89.3 07/29/2020 0506   MCH 29.1 07/29/2020 0506   MCHC 32.6 07/29/2020 0506   RDW 13.2 07/29/2020 0506   LYMPHSABS 1.3 07/29/2020 0506   MONOABS 0.7 07/29/2020 0506   EOSABS 0.2 07/29/2020 0506   BASOSABS 0.0 07/29/2020 0506   BMP Latest Ref Rng & Units 07/29/2020 07/28/2020 07/27/2020  Glucose 70 - 99 mg/dL 134(H) 163(H) 248(H)  BUN 6 - 20 mg/dL 29(H) 34(H) 34(H)  Creatinine 0.61 - 1.24 mg/dL 0.96 1.16 0.97  Sodium 135 - 145 mmol/L 140 142 138  Potassium 3.5 - 5.1 mmol/L 3.8 4.1 3.9  Chloride 98 - 111 mmol/L 102 102 102  CO2 22 - 32 mmol/L 27 27 23   Calcium 8.9 - 10.3 mg/dL 8.9 9.3 8.7(L)     Chest imaging: CTA Chest 08/17/20 1. Widespread patchy pulmonary infiltrates consistent with coronavirus infection. Some of the density could relate to pre-existing chronic interstitial lung disease and upper lobe emphysema, with the findings could all relate to active infection or sequela of previous infection. 2. Single small pulmonary embolus in the right lower lobe. No evidence of extensive embolic disease.  CXR 07/23/20 Cardiomediastinal silhouette is normal. Mediastinal contours appear intact.  Hazy airspace opacities in bilateral lungs with lower lobe predominance.  Osseous structures are without acute abnormality. Soft tissues are grossly normal.  PFT: None on file  Labs: Reviewed as  above  Assessment & Plan:   Pneumonia due to COVID-19 virus  Chronic respiratory failure with hypoxia ([place])  Discussion: Ricardo Davis is a 59 year old male with hypertension who returns to pulmonary clinic for follow up of covid 19 pneumonia, pulmonary emboli and chronic hypoxemic respiratory failure.   Ambulatory oxygen saturations did not drop below 90% after walking 3 laps in clinic today. He will be scheduled for pulmonary function testing and 6MWT.   He is to continue using combivent inhaler as needed up to 3 times per day and flovent 253mg 2 puffs twice daily.  He is to continue eliquis therapy for at least 6 months.   Patient to follow up in 8 weeks  JFreda Jackson MD [facility] Office: [phone]   Current Outpatient Medications:  .  amLODipine (NORVASC) 5 MG tablet, Take 5 mg by mouth at bedtime. , Disp: , Rfl:  .  apixaban (ELIQUIS) 5 MG TABS tablet, Take 2 tablets (166m twice daily for 7 days, then 1 tablet (46m23mtwice daily, Disp: 60 tablet, Rfl: 5 .  blood glucose meter kit and supplies KIT, Dispense based on patient and insurance preference. Use up to four times daily as directed. (FOR ICD-9 250.00, 250.01)., Disp: 1 each, Rfl: 0 .  diphenhydrAMINE-APAP, sleep, (ACETAMINOPHEN PM PO), Take 1 tablet by mouth at bedtime as needed (pain, sleep)., Disp: , Rfl:  .  diphenhydrAMINE-PE-APAP (DELSYM COUGH/COLD NIGHT TIME PO), Take 1 Dose by mouth at bedtime as needed (cough, sleep)., Disp: , Rfl:  .  fluticasone (FLOVENT HFA) 220 MCG/ACT inhaler, Inhale 2 puffs into the lungs in the morning and at bedtime., Disp: 1 each, Rfl: 12 .  hydrocortisone (ANUSOL-HC) 25 MG suppository, Place 1 suppository (25 mg total) rectally 2 (two) times daily., Disp: 12 suppository, Rfl: 0 .  Ipratropium-Albuterol (COMBIVENT) 20-100 MCG/ACT AERS respimat, Inhale 1 puff into the lungs every 6 (six) hours as needed for wheezing or shortness of breath., Disp: 4 g, Rfl: 0 .   zolpidem (AMBIEN) 10 MG tablet, Take 10 mg by mouth at bedtime. , Disp: , Rfl:

## 2020-09-19 NOTE — Patient Instructions (Signed)
Continue flovent 2 puffs twice daily Continue combivent 3 times daily as needed  Follow up in 2 months with 6 minute walk test on room air

## 2020-09-29 ENCOUNTER — Telehealth: Payer: Self-pay | Admitting: Pulmonary Disease

## 2020-09-29 NOTE — Telephone Encounter (Signed)
Rec'd disability forms via fax from [employer]. Prepared paperwork, will give to Dr. Francine Graven on Monday 12/6 to sign. -pr

## 2020-10-06 NOTE — Telephone Encounter (Signed)
Rec'd signed form back from Dr. Francine Graven - Faxed to [employer] at [phone]. -pr

## 2020-10-13 ENCOUNTER — Telehealth: Payer: Self-pay | Admitting: Pulmonary Disease

## 2020-10-13 NOTE — Telephone Encounter (Signed)
lmtcb for pt's wife, Drinda Butts.

## 2020-10-14 NOTE — Telephone Encounter (Signed)
Called and spoke to pt's wife. Pt has lost his job thus lost his insurance coverage. Pt is unable to afford Eliquis and Flovent. We do not have samples of Eliquis nor Flovent. It would take too long for pt to enroll in pt assistance for Eliquis. Pt will be out of Eliquis on 10/17/20.  Dr. Francine Graven, please advise if have any ideas on pts situation for Eliquis and is there another inhaler we can give out samples. We do not have any of just ICS, we only have some ICS/LABA.   Lauren, please advise if we have a drug rep that brings Eliquis samples or any ideas.

## 2020-10-14 NOTE — Telephone Encounter (Signed)
AT this time there are not samples being brought to the office.

## 2020-10-17 NOTE — Telephone Encounter (Signed)
Patient should be transitioned to coumadin which would be the cheapest option but would require frequent lab work for INR monitoring. Is there a coumadin clinic that he could be referred to for management of this? Providing him with samples of ICS/LABA would be ok for the next month or two until he can figure out further assistance. Is there an outpatient case management team or social work team he could be referred to or given a number to call for further help.   Thanks, Cletis Athens

## 2020-10-17 NOTE — Telephone Encounter (Signed)
Spoke with the pt's spouse  She states that she is unsure if the pt could do coumadin clinic b/c she is worried about cost of having labs done frequently without any insurance  She asks specifically if plavix could be considered instead  Please advise on this and which ICS/LABA that you would like to give him samples of  Thanks

## 2020-10-17 NOTE — Telephone Encounter (Signed)
Message continued  Pt wife found an alternative medication and looked up on good rx and is only $34 and that's doable. Plavix is the alternate medication- 30 tab of 75mg  is $34. Please advise [phone]

## 2020-10-17 NOTE — Telephone Encounter (Signed)
Plavix is not a substitute for anticoagulation for PE. Can we please include our pharmacy colleagues to help [place] find a possible solution for the cost of Eliquis? Otherwise will need to pursue an alternative anti-coagulation strategy which it seems Dr. Francine Graven is already considering. Thanks!

## 2020-10-18 NOTE — Telephone Encounter (Signed)
Pharmacy team, do you know of any alternatives or ideas that could help pt?   PCCs, do you know of a referral that helps pt's specifically with their medication? I.e Child psychotherapist.  Pt lost his job and cannot afford his Eliquis.

## 2020-10-18 NOTE — Telephone Encounter (Addendum)
Eliquis has a patient assistance program through [employer]. If he's uninsured, then he should qualify and be able to get the medication for free.  Application is found here: [email or web address]  He will need to submit: - Signed patient portion of app - Proof of income: most recent federal tax return. If your federal tax return is not available, then any of the following: W2, 1099, pension statement, Social Security statement, at least 2 consecutive pay stubs. - Proof of out-of-pocket prescription expenses for the household (which his local pharmacy can easily pull a report for once he requests it)  Dr. Judeth Horn - you'll have to sign the prescriber form.  Chesley Mires, PharmD, MPH Clinical Pharmacist (Rheumatology and Pulmonology)

## 2020-10-18 NOTE — Telephone Encounter (Signed)
Spoke with the pt's spouse  She states that the rx for Eliquis went through this time  She is unsure if it will go through the next time  She does want to have pt fill out assistance forms  I have printed these and placed in Dr Huncucker's lookat to be signed- page 4 at the bottom  Once signed let triage know and we will call spouse to pick up so pt can complete his portion, thanks!!

## 2020-10-18 NOTE — Telephone Encounter (Signed)
Dr Judeth Horn has signed form  I spoke with spouse and she wants to have it mailed to them to complete  I have verified his address and mailed to the pt

## 2020-10-18 NOTE — Telephone Encounter (Signed)
Happy to sign. Thank you!

## 2020-11-21 ENCOUNTER — Other Ambulatory Visit (HOSPITAL_COMMUNITY)
Admission: RE | Admit: 2020-11-21 | Discharge: 2020-11-21 | Disposition: A | Discharge status: Home / Self Care | Payer: HRSA Program | Source: Ambulatory Visit | Attending: Pulmonary Disease | Admitting: Pulmonary Disease

## 2020-11-21 ENCOUNTER — Ambulatory Visit: Payer: Commercial Managed Care - PPO | Admitting: Pulmonary Disease

## 2020-11-21 DIAGNOSIS — Z20822 Contact with and (suspected) exposure to covid-19: Secondary | ICD-10-CM | POA: Insufficient documentation

## 2020-11-21 LAB — SARS CORONAVIRUS 2 (TAT 6-24 HRS): SARS Coronavirus 2: NEGATIVE

## 2020-11-24 ENCOUNTER — Ambulatory Visit (INDEPENDENT_AMBULATORY_CARE_PROVIDER_SITE_OTHER): Payer: HRSA Program | Admitting: Pulmonary Disease

## 2020-11-24 ENCOUNTER — Encounter: Payer: Self-pay | Admitting: Pulmonary Disease

## 2020-11-24 ENCOUNTER — Other Ambulatory Visit: Payer: Self-pay

## 2020-11-24 ENCOUNTER — Ambulatory Visit (INDEPENDENT_AMBULATORY_CARE_PROVIDER_SITE_OTHER): Payer: Self-pay | Admitting: Pulmonary Disease

## 2020-11-24 VITALS — BP 140/80 | HR 89 | Temp 98.1°F | Ht 74.0 in | Wt 270.2 lb

## 2020-11-24 DIAGNOSIS — U071 COVID-19: Secondary | ICD-10-CM

## 2020-11-24 DIAGNOSIS — R0902 Hypoxemia: Secondary | ICD-10-CM | POA: Diagnosis not present

## 2020-11-24 DIAGNOSIS — J9611 Chronic respiratory failure with hypoxia: Secondary | ICD-10-CM

## 2020-11-24 DIAGNOSIS — I2693 Single subsegmental pulmonary embolism without acute cor pulmonale: Secondary | ICD-10-CM

## 2020-11-24 DIAGNOSIS — J1282 Pneumonia due to coronavirus disease 2019: Secondary | ICD-10-CM

## 2020-11-24 DIAGNOSIS — J984 Other disorders of lung: Secondary | ICD-10-CM | POA: Diagnosis not present

## 2020-11-24 LAB — PULMONARY FUNCTION TEST
DL/VA % pred: 104 %
DL/VA: 4.41 ml/min/mmHg/L
DLCO cor % pred: 79 %
DLCO cor: 25.07 ml/min/mmHg
DLCO unc % pred: 79 %
DLCO unc: 25.07 ml/min/mmHg
FEF 25-75 Post: 4.38 L/sec
FEF 25-75 Pre: 4.05 L/sec
FEF2575-%Change-Post: 8 %
FEF2575-%Pred-Post: 124 %
FEF2575-%Pred-Pre: 115 %
FEV1-%Change-Post: 2 %
FEV1-%Pred-Post: 84 %
FEV1-%Pred-Pre: 82 %
FEV1-Post: 3.56 L
FEV1-Pre: 3.48 L
FEV1FVC-%Change-Post: 4 %
FEV1FVC-%Pred-Pre: 109 %
FEV6-%Change-Post: -1 %
FEV6-%Pred-Post: 77 %
FEV6-%Pred-Pre: 78 %
FEV6-Post: 4.11 L
FEV6-Pre: 4.17 L
FEV6FVC-%Pred-Post: 104 %
FEV6FVC-%Pred-Pre: 104 %
FVC-%Change-Post: -1 %
FVC-%Pred-Post: 73 %
FVC-%Pred-Pre: 75 %
FVC-Post: 4.11 L
FVC-Pre: 4.17 L
Post FEV1/FVC ratio: 87 %
Post FEV6/FVC ratio: 100 %
Pre FEV1/FVC ratio: 83 %
Pre FEV6/FVC Ratio: 100 %
RV % pred: 52 %
RV: 1.27 L
TLC % pred: 69 %
TLC: 5.42 L

## 2020-11-24 NOTE — Patient Instructions (Addendum)
You can stop eliquis at the end of February when your last refill is done.  You can stop combivent inhaler  You are safe to return to work

## 2020-11-24 NOTE — Progress Notes (Signed)
PFT done today. 

## 2020-11-24 NOTE — Progress Notes (Signed)
Synopsis: Referred by Dia Crawford, MD for Acute Respiratory Failure from Covid Pneumonia  Subjective:   [name] GENDER: male DOB: [birth year], MRN: [id number]  HPI  Chief Complaint  Patient presents with  . Follow-up    No complaints currently   Ricardo Davis is a 60 year old male with hypertension who returns to pulmonary clinic for follow up of covid 19 pneumonia, pulmonary emboli and chronic hypoxemic respiratory failure.   He is doing well since last visit. Oxygen was stopped at the last visit as he maintained oxygen saturations above 88% while walking. He continues to use combivent inhaler without much improvement in his breathing. He denies cough or wheezing. He continues to experience exertional shortness of breath. He has gained 26lbs since last visit. He continues on eliquis without issues of bleeding. He picked up 1 more month of eliquis and will have a supply until the end of February which will be 4 months of anticoagulation treatment.  He is requesting a prescription for hydroxychloroquine to have incase he becomes reinfected with Covid 19 again.   OV 08/2020: He was diagnosed with covid on 07/18/20 while in [place] and was treated with outpatient azithromycin and dexamethasone. His symptoms progressed and he was admitted 07/23/20 to 07/29/20 for respiratory failure in setting of covid 19. He was unvaccinated. He received baricitinib for 10 days, remdesivir for 5 days and solumedrol 73m. He was discharged on 4L of oxygen with ambulation while saturating 89-90% and was saturating around 89-90% at rest on room air.   He is currently on 1.5L at rest. He reports walking around his home and property without oxygen. Saturations can drop into the 70s per his wife. He overall reports feeling a lot better but continues to have exertional dyspnea. He is not planned to return to work until the new year.   He is taking flovent 2275m 2 puffs twice daily and has as needed  combivent.   He started eliquis at last visit for right lower lobe segmental pulmonary emboli noted on CTA chest scan. He stopped this mediation about a week ago as he did not know he was supposed to continue this medicaiton.  He is a former smoker, quit in 2006. He smoked for 15-20 years, 1.5 packs per day so he has a 22.5 to 30 year smoking history. 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   He has intentionally lost 100lbs over the past 1 year with altering his diet.   His mother was a smoker and had throat cancer. He denies history of blood clots.   Past Medical History:  Diagnosis Date  . Hypertension      History reviewed. No pertinent family history.   Social History   Socioeconomic History  . Marital status: Married    Spouse name: Not on file  . Number of children: Not on file  . Years of education: Not on file  . Highest education level: Not on file  Occupational History  . Not on file  Tobacco Use  . Smoking status: Former Smoker    Types: Cigarettes    Start date: 10/29/1981    Quit date: 11/20/1999    Years since quitting: 21.0  . Smokeless tobacco: Current User    Types: Snuff  Vaping Use  . Vaping Use: Never used  Substance and Sexual Activity  . Alcohol use: Not Currently  . Drug use: Never  . Sexual activity:  Yes    Birth control/protection: None  Other Topics Concern  . Not on file  Social History Narrative  . Not on file   Social Determinants of Health   Financial Resource Strain: Not on file  Food Insecurity: Not on file  Transportation Needs: Not on file  Physical Activity: Not on file  Stress: Not on file  Social Connections: Not on file  Intimate Partner Violence: Not on file     No Known Allergies   Outpatient Medications Prior to Visit  Medication Sig Dispense Refill  . amLODipine (NORVASC) 5 MG tablet Take 5 mg by mouth at bedtime.     Marland Kitchen apixaban (ELIQUIS) 5 MG TABS tablet Take 2  tablets (49m) twice daily for 7 days, then 1 tablet (550m twice daily 60 tablet 5  . blood glucose meter kit and supplies KIT Dispense based on patient and insurance preference. Use up to four times daily as directed. (FOR ICD-9 250.00, 250.01). 1 each 0  . diphenhydrAMINE-APAP, sleep, (ACETAMINOPHEN PM PO) Take 1 tablet by mouth at bedtime as needed (pain, sleep).    . diphenhydrAMINE-PE-APAP (DELSYM COUGH/COLD NIGHT TIME PO) Take 1 Dose by mouth at bedtime as needed (cough, sleep).    . hydrocortisone (ANUSOL-HC) 25 MG suppository Place 1 suppository (25 mg total) rectally 2 (two) times daily. 12 suppository 0  . Ipratropium-Albuterol (COMBIVENT) 20-100 MCG/ACT AERS respimat Inhale 1 puff into the lungs every 6 (six) hours as needed for wheezing or shortness of breath. 4 g 0  . zolpidem (AMBIEN) 10 MG tablet Take 10 mg by mouth at bedtime.    . fluticasone (FLOVENT HFA) 220 MCG/ACT inhaler Inhale 2 puffs into the lungs in the morning and at bedtime. (Patient not taking: Reported on 11/24/2020) 1 each 12   No facility-administered medications prior to visit.    Review of Systems  Constitutional: Negative for chills, fever, malaise/fatigue and weight loss.  HENT: Negative for congestion and sore throat.   Eyes: Negative for blurred vision.  Respiratory: Positive for shortness of breath. Negative for hemoptysis and wheezing.   Cardiovascular: Negative for chest pain, palpitations, orthopnea, claudication, leg swelling and PND.  Gastrointestinal: Negative for abdominal pain, heartburn, nausea and vomiting.  Genitourinary: Negative for hematuria.  Musculoskeletal: Negative for joint pain and myalgias.  Skin: Negative for itching and rash.  Neurological: Negative for dizziness and headaches.  Endo/Heme/Allergies: Does not bruise/bleed easily.  Psychiatric/Behavioral: Negative.    Objective:   Vitals:   11/24/20 1347  BP: 140/80  Pulse: 89  Temp: 98.1 F (36.7 C)  TempSrc: Other  (Comment)  SpO2: 96%  Weight: 270 lb 3.2 oz (122.6 kg)  Height: 6' 2"  (1.88 m)     Physical Exam Constitutional:      General: He is not in acute distress.    Appearance: Normal appearance. He is not ill-appearing.  HENT:     Head: Normocephalic and atraumatic.     Nose: Nose normal.  Eyes:     General: No scleral icterus.    Conjunctiva/sclera: Conjunctivae normal.     Pupils: Pupils are equal, round, and reactive to light.  Cardiovascular:     Rate and Rhythm: Normal rate and regular rhythm.     Pulses: Normal pulses.     Heart sounds: Normal heart sounds. No murmur heard.   Pulmonary:     Effort: Pulmonary effort is normal.     Breath sounds: No decreased air movement. No rales present. No wheezing or rhonchi.  Abdominal:  General: Bowel sounds are normal.     Palpations: Abdomen is soft.  Musculoskeletal:     Right lower leg: No edema.     Left lower leg: No edema.  Neurological:     General: No focal deficit present.     Mental Status: He is alert and oriented to person, place, and time. Mental status is at baseline.  Psychiatric:        Mood and Affect: Mood normal.        Behavior: Behavior normal.        Thought Content: Thought content normal.        Judgment: Judgment normal.   CBC    Component Value Date/Time   WBC 10.2 07/29/2020 0506   RBC 4.84 07/29/2020 0506   HGB 14.1 07/29/2020 0506   HCT 43.2 07/29/2020 0506   PLT 290 07/29/2020 0506   MCV 89.3 07/29/2020 0506   MCH 29.1 07/29/2020 0506   MCHC 32.6 07/29/2020 0506   RDW 13.2 07/29/2020 0506   LYMPHSABS 1.3 07/29/2020 0506   MONOABS 0.7 07/29/2020 0506   EOSABS 0.2 07/29/2020 0506   BASOSABS 0.0 07/29/2020 0506   BMP Latest Ref Rng & Units 07/29/2020 07/28/2020 07/27/2020  Glucose 70 - 99 mg/dL 134(H) 163(H) 248(H)  BUN 6 - 20 mg/dL 29(H) 34(H) 34(H)  Creatinine 0.61 - 1.24 mg/dL 0.96 1.16 0.97  Sodium 135 - 145 mmol/L 140 142 138  Potassium 3.5 - 5.1 mmol/L 3.8 4.1 3.9  Chloride 98 -  111 mmol/L 102 102 102  CO2 22 - 32 mmol/L 27 27 23   Calcium 8.9 - 10.3 mg/dL 8.9 9.3 8.7(L)     Chest imaging: CTA Chest 08/17/20 1. Widespread patchy pulmonary infiltrates consistent with coronavirus infection. Some of the density could relate to pre-existing chronic interstitial lung disease and upper lobe emphysema, with the findings could all relate to active infection or sequela of previous infection. 2. Single small pulmonary embolus in the right lower lobe. No evidence of extensive embolic disease.  CXR 07/23/20 Cardiomediastinal silhouette is normal. Mediastinal contours appear intact.  Hazy airspace opacities in bilateral lungs with lower lobe predominance.  Osseous structures are without acute abnormality. Soft tissues are grossly normal.  PFT: 11/24/20 Mild restrictive defect. Normal diffusion capacity  Labs: Reviewed as above  Assessment & Plan:   Pneumonia due to COVID-19 virus  Restrictive lung disease  Single subsegmental pulmonary embolism without acute cor pulmonale (HCC)  Hypoxia - Plan: Ambulatory Referral for DME  Discussion: Ricardo Davis is a 60 year old male with hypertension who returns to pulmonary clinic for follow up of covid 19 pneumonia, pulmonary emboli and chronic hypoxemic respiratory failure.   His chronic hypoxemic respiratory failure has resolved as he no longer requires supplemental oxygen.   He can stop combivent inhaler use as he does not benefit from this therapy.   He has mild restrictive defect on his pulmonary function tests which could be due to his obesity or effects from his covid infection.   He is to take eliquis until the end of February which will give him 4 months of treatment for the provoked blood clot during his acute covid 19 infection.   Per his request for hydroxychloroquine prescription to have in case of re-infection with covid 19, I informed him of the medical studies that do not show evidence for this  treatment. I printed out a Cochrane Review summary for his review about the evidence for hydroxychloroquine in Covid 19 treatment and he  refused to take it with him.   He is safe to return to work full time.    Follow up as needed.  Freda Jackson, MD [facility] Office: [phone]   Current Outpatient Medications:  .  amLODipine (NORVASC) 5 MG tablet, Take 5 mg by mouth at bedtime. , Disp: , Rfl:  .  apixaban (ELIQUIS) 5 MG TABS tablet, Take 2 tablets (63m) twice daily for 7 days, then 1 tablet (535m twice daily, Disp: 60 tablet, Rfl: 5 .  blood glucose meter kit and supplies KIT, Dispense based on patient and insurance preference. Use up to four times daily as directed. (FOR ICD-9 250.00, 250.01)., Disp: 1 each, Rfl: 0 .  diphenhydrAMINE-APAP, sleep, (ACETAMINOPHEN PM PO), Take 1 tablet by mouth at bedtime as needed (pain, sleep)., Disp: , Rfl:  .  diphenhydrAMINE-PE-APAP (DELSYM COUGH/COLD NIGHT TIME PO), Take 1 Dose by mouth at bedtime as needed (cough, sleep)., Disp: , Rfl:  .  hydrocortisone (ANUSOL-HC) 25 MG suppository, Place 1 suppository (25 mg total) rectally 2 (two) times daily., Disp: 12 suppository, Rfl: 0 .  Ipratropium-Albuterol (COMBIVENT) 20-100 MCG/ACT AERS respimat, Inhale 1 puff into the lungs every 6 (six) hours as needed for wheezing or shortness of breath., Disp: 4 g, Rfl: 0 .  zolpidem (AMBIEN) 10 MG tablet, Take 10 mg by mouth at bedtime., Disp: , Rfl:  .  fluticasone (FLOVENT HFA) 220 MCG/ACT inhaler, Inhale 2 puffs into the lungs in the morning and at bedtime. (Patient not taking: Reported on 11/24/2020), Disp: 1 each, Rfl: 12

## 2020-11-28 ENCOUNTER — Telehealth: Payer: Self-pay

## 2020-11-28 NOTE — Telephone Encounter (Signed)
Patient had brought in disability paperwork for Dr Francine Graven to fill out the day of his office visit on 11/24/2020. Paperwork was signed by Dr Francine Graven and office notes printed by Dr and all was faxed today 11/28/2020 to number listed on paperwork fax #: [phone] with a success result. Paperwork placed in scan folder per Rodell Perna to be scanned into patient chart. Nothing further needed at this time.

## 2022-02-26 DEATH — deceased

## 2022-09-20 IMAGING — DX DG CHEST 1V PORT
1 series · 1 of 1 positions shown · non-contrast
Comparison: None.

CLINICAL DATA: Shortness of breath.

[id number] positive.
EXAM:
PORTABLE CHEST 1 VIEW

[chest ap]
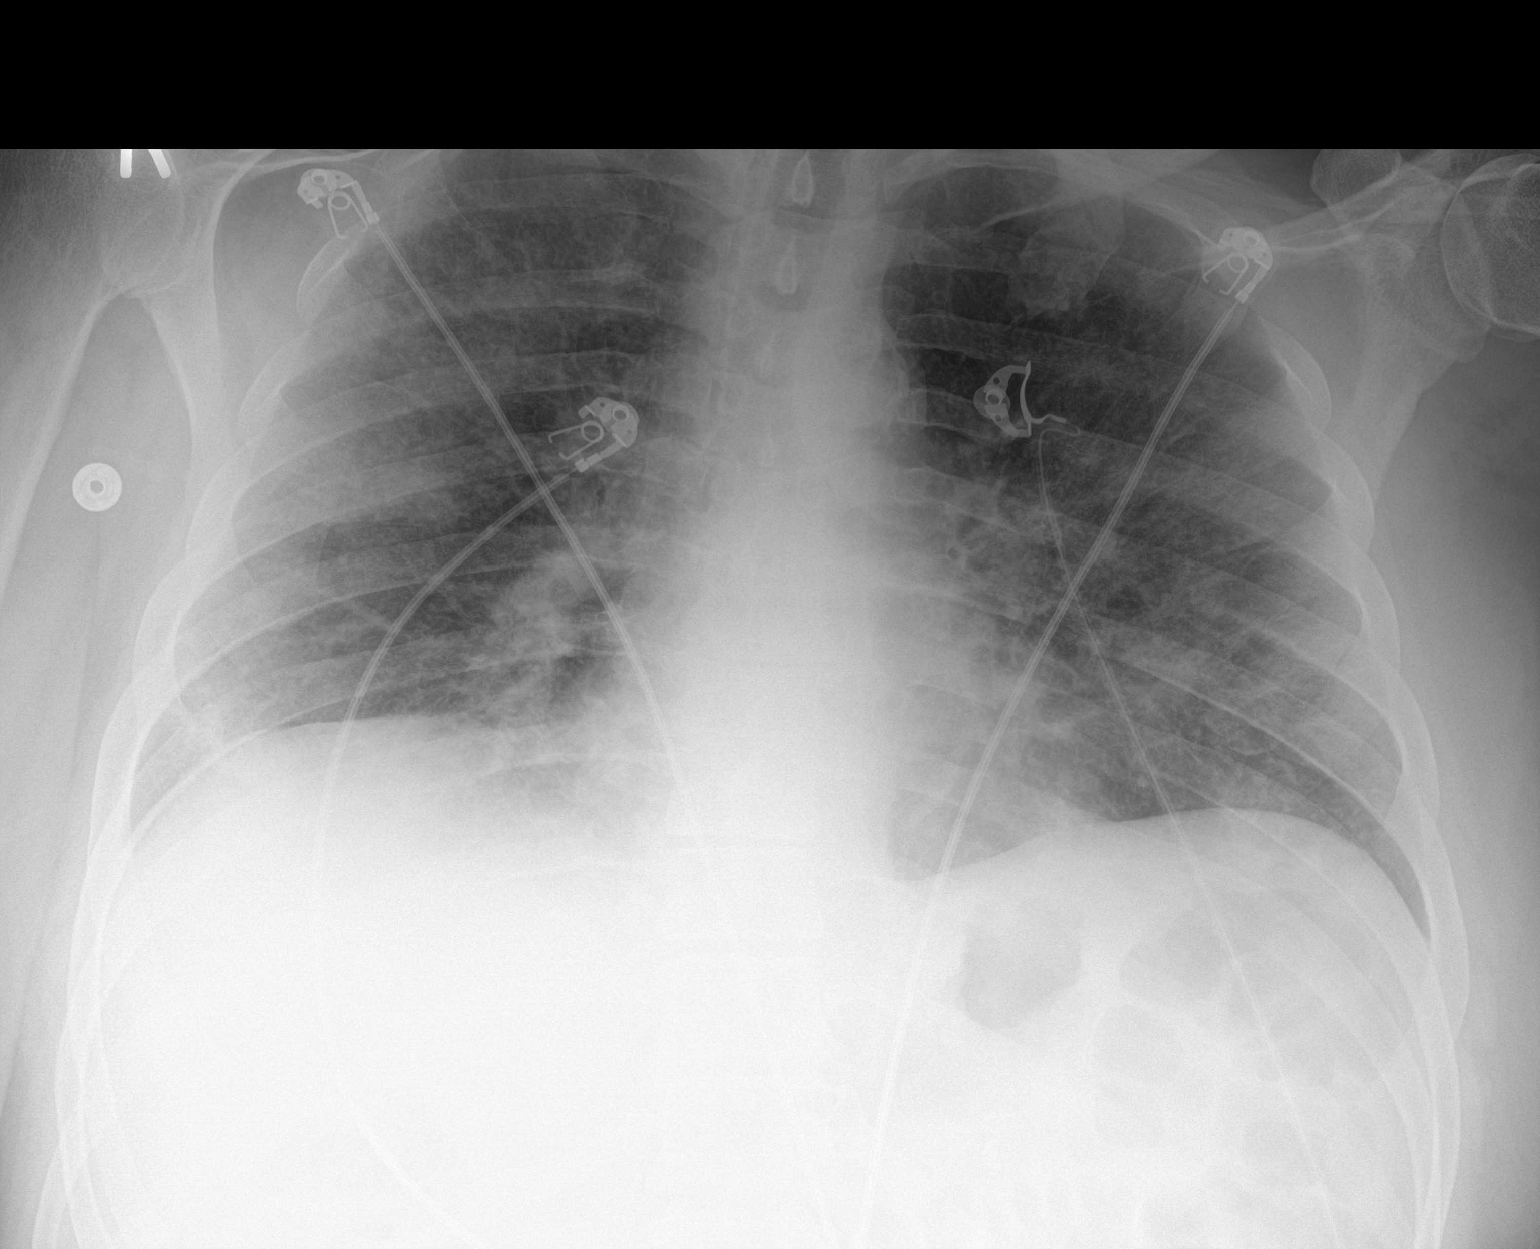

[1 of 1 positions shown; findings below may reference images not displayed]

FINDINGS: Cardiomediastinal silhouette is normal. Mediastinal contours appear
intact.

Hazy airspace opacities in bilateral lungs with lower lobe
predominance.

Osseous structures are without acute abnormality. Soft tissues are
grossly normal.
IMPRESSION: Hazy airspace opacities in bilateral lungs with lower lobe
predominance, consistent with multifocal atypical pneumonia.
